# Patient Record
Sex: Female | Born: 1955 | Race: White | Hispanic: No | Smoking: Never smoker
Health system: Southern US, Community
[De-identification: ages and names within clinical notes are randomized; demographics above are authoritative.]

## PROBLEM LIST (undated history)

## (undated) DIAGNOSIS — E119 Type 2 diabetes mellitus without complications: Secondary | ICD-10-CM

## (undated) DIAGNOSIS — I10 Essential (primary) hypertension: Secondary | ICD-10-CM

## (undated) HISTORY — DX: Type 2 diabetes mellitus without complications: E11.9

## (undated) HISTORY — PX: FRACTURE SURGERY: SHX138

## (undated) HISTORY — PX: OTHER SURGICAL HISTORY: SHX169

## (undated) HISTORY — PX: SHOULDER SURGERY: SHX246

## (undated) HISTORY — PX: EYE SURGERY: SHX253

## (undated) HISTORY — DX: Essential (primary) hypertension: I10

## (undated) HISTORY — PX: DILATION AND CURETTAGE OF UTERUS: SHX78

---

## 2001-03-16 ENCOUNTER — Emergency Department (HOSPITAL_COMMUNITY): Admission: EM | Admit: 2001-03-16 | Discharge: 2001-03-16 | Payer: Self-pay | Admitting: *Deleted

## 2001-03-16 ENCOUNTER — Encounter: Payer: Self-pay | Admitting: *Deleted

## 2003-01-10 ENCOUNTER — Ambulatory Visit (HOSPITAL_COMMUNITY): Admission: RE | Admit: 2003-01-10 | Discharge: 2003-01-10 | Payer: Self-pay | Admitting: Family Medicine

## 2003-08-28 ENCOUNTER — Ambulatory Visit (HOSPITAL_COMMUNITY): Admission: RE | Admit: 2003-08-28 | Discharge: 2003-08-28 | Payer: Self-pay | Admitting: Obstetrics and Gynecology

## 2003-09-25 ENCOUNTER — Ambulatory Visit (HOSPITAL_COMMUNITY): Admission: RE | Admit: 2003-09-25 | Discharge: 2003-09-25 | Payer: Self-pay | Admitting: Internal Medicine

## 2003-09-26 ENCOUNTER — Encounter (INDEPENDENT_AMBULATORY_CARE_PROVIDER_SITE_OTHER): Payer: Self-pay | Admitting: *Deleted

## 2003-09-26 ENCOUNTER — Ambulatory Visit (HOSPITAL_COMMUNITY): Admission: RE | Admit: 2003-09-26 | Discharge: 2003-09-26 | Payer: Self-pay | Admitting: General Surgery

## 2003-09-26 ENCOUNTER — Ambulatory Visit (HOSPITAL_BASED_OUTPATIENT_CLINIC_OR_DEPARTMENT_OTHER): Admission: RE | Admit: 2003-09-26 | Discharge: 2003-09-26 | Payer: Self-pay | Admitting: General Surgery

## 2003-10-07 ENCOUNTER — Ambulatory Visit (HOSPITAL_COMMUNITY): Admission: RE | Admit: 2003-10-07 | Discharge: 2003-10-07 | Payer: Self-pay | Admitting: Family Medicine

## 2008-11-28 ENCOUNTER — Ambulatory Visit (HOSPITAL_COMMUNITY): Admission: RE | Admit: 2008-11-28 | Discharge: 2008-11-28 | Payer: Self-pay | Admitting: Obstetrics and Gynecology

## 2010-01-31 ENCOUNTER — Encounter: Payer: Self-pay | Admitting: Obstetrics and Gynecology

## 2010-02-01 ENCOUNTER — Encounter: Payer: Self-pay | Admitting: Obstetrics and Gynecology

## 2010-02-23 ENCOUNTER — Emergency Department (HOSPITAL_COMMUNITY)
Admission: EM | Admit: 2010-02-23 | Discharge: 2010-02-23 | Disposition: A | Discharge status: Home / Self Care | Payer: BC Managed Care – PPO | Attending: Emergency Medicine | Admitting: Emergency Medicine

## 2010-02-23 ENCOUNTER — Emergency Department (HOSPITAL_COMMUNITY): Payer: BC Managed Care – PPO

## 2010-02-23 DIAGNOSIS — R079 Chest pain, unspecified: Secondary | ICD-10-CM | POA: Insufficient documentation

## 2010-02-23 DIAGNOSIS — I10 Essential (primary) hypertension: Secondary | ICD-10-CM | POA: Insufficient documentation

## 2010-02-23 DIAGNOSIS — E119 Type 2 diabetes mellitus without complications: Secondary | ICD-10-CM | POA: Insufficient documentation

## 2010-02-23 DIAGNOSIS — Z79899 Other long term (current) drug therapy: Secondary | ICD-10-CM | POA: Insufficient documentation

## 2010-02-23 LAB — CBC
HCT: 36.1 % (ref 36.0–46.0)
Hemoglobin: 12.5 g/dL (ref 12.0–15.0)
MCH: 29.1 pg (ref 26.0–34.0)
MCHC: 34.6 g/dL (ref 30.0–36.0)
MCV: 84 fL (ref 78.0–100.0)
Platelets: 209 K/uL (ref 150–400)
RBC: 4.3 MIL/uL (ref 3.87–5.11)
RDW: 13.4 % (ref 11.5–15.5)
WBC: 7.3 K/uL (ref 4.0–10.5)

## 2010-02-23 LAB — DIFFERENTIAL
Basophils Absolute: 0 K/uL (ref 0.0–0.1)
Basophils Relative: 0 % (ref 0–1)
Eosinophils Absolute: 0.2 K/uL (ref 0.0–0.7)
Eosinophils Relative: 3 % (ref 0–5)
Lymphocytes Relative: 22 % (ref 12–46)
Lymphs Abs: 1.6 K/uL (ref 0.7–4.0)
Monocytes Absolute: 0.8 10*3/uL (ref 0.1–1.0)
Monocytes Relative: 11 % (ref 3–12)
Neutro Abs: 4.7 10*3/uL (ref 1.7–7.7)
Neutrophils Relative %: 65 % (ref 43–77)

## 2010-02-23 LAB — CK TOTAL AND CKMB (NOT AT ARMC)
CK, MB: 1.7 ng/mL (ref 0.3–4.0)
Relative Index: INVALID (ref 0.0–2.5)
Total CK: 63 U/L (ref 7–177)

## 2010-02-23 LAB — TROPONIN I: Troponin I: 0.02 ng/mL (ref 0.00–0.06)

## 2010-02-23 LAB — BASIC METABOLIC PANEL
BUN: 8 mg/dL (ref 6–23)
Calcium: 9.2 mg/dL (ref 8.4–10.5)
Creatinine, Ser: 0.57 mg/dL (ref 0.4–1.2)
GFR calc Af Amer: >60 mL/min (ref >60)
GFR calc non Af Amer: >60 mL/min (ref >60)
Sodium: 137 mEq/L (ref 135–145)

## 2010-02-23 LAB — POCT CARDIAC MARKERS
CKMB, poc: <1 ng/mL — ABNORMAL LOW (ref 1.0–8.0)
Myoglobin, poc: 41.6 ng/mL (ref 12–200)
Troponin i, poc: 0.07 ng/mL (ref 0.00–0.09)

## 2010-02-23 LAB — D-DIMER, QUANTITATIVE: D-Dimer, Quant: 0.4 ug{FEU}/mL (ref 0.00–0.48)

## 2010-02-23 LAB — BASIC METABOLIC PANEL WITH GFR
CO2: 27 meq/L (ref 19–32)
Chloride: 100 meq/L (ref 96–112)
Glucose, Bld: 158 mg/dL — ABNORMAL HIGH (ref 70–99)
Potassium: 3.6 meq/L (ref 3.5–5.1)

## 2010-05-29 NOTE — Op Note (Signed)
NAME:  Jennifer Carey, Jennifer Carey                           ACCOUNT NO.:  [id number]   MEDICAL RECORD NO.:  [id number]                   PATIENT TYPE:  AMB   LOCATION:  DAY                                  FACILITY:  [facility]   PHYSICIAN:  Lionel December, M.D.                 DATE OF BIRTH:  [birth year]   DATE OF PROCEDURE:  09/25/2003  DATE OF DISCHARGE:                                 OPERATIVE REPORT   PROCEDURE:  Total colonoscopy.   INDICATIONS FOR PROCEDURE:  Jennifer Carey is a 55 year old Caucasian female who was  noted to have Hemoccult-positive stool on exam recently.  She had one five  years ago but opted not to be evaluated.  She has occasional hematochezia.  Family history is negative for colorectal carcinoma.  She really does not  have any GI symptoms.  She does take ASA every now and then but has not had  any in the last four weeks or so.  The procedure risks were reviewed with  the patient, and informed consent was obtained.   PREOPERATIVE MEDICATIONS:  Demerol 50 mg IV, Versed 6 mg IV in divided dose.   FINDINGS:  The procedure was performed in the endoscopy suite.  The  patient's vital signs and O2 saturations were monitored during the procedure  and remained stable.  The patient was placed in the left lateral recumbent  position and rectal examination performed.  No abnormality noted on external  or digital exam.  The Olympus videoscope was placed into the rectum and  advanced into the region of the sigmoid colon and beyond.  The preparation  was satisfactory.  The scope was then advanced into the cecum which was  identified by the appendiceal orifice and ileocecal valve.  Pictures were  taken for the record.  As the scope was withdrawn, the colonic mucosa was  once again carefully examined.  There were diverticula scattered throughout  the colon.  The larger ones were more in the proximal transverse colon and  ascending colon.  There were no polyps and/or tumor masses.  The rectal  mucosa  was normal.  The scope was retroflexed to examine the anorectal  junction, and she had small hemorrhoids below the dentate line and a small  anal papilla.  The endoscope was straightened and withdrawn.  The patient  tolerated the procedure well.   FINAL DIAGNOSES:  1.  Pancolonic diverticulosis.  2.  Small external hemorrhoids.  3.  Her Hemoccult-positive stool could have been due to nonsteroidal anti-      inflammatory drug use.   RECOMMENDATIONS:  1.  High fiber diet.  2.  Citrucel one tablespoon full daily.  3.  She will have CBC and Hemoccults repeated in three months through our      office.  If her H&H is normal and stool is guaiac-negative, no further      evaluation would be needed.  ___________________________________________                                            Lionel December, M.D.   NR/MEDQ  D:  09/25/2003  T:  09/25/2003  Job:  [id number]   cc:   Cyril Mourning, P.A.   Tilda Burrow, M.D.  [address] [place]  [place] [address]  Fax: [phone]   Corrie Mckusick, M.D.  [address] [place]ure[place]s. [place]  [place]  [place] [address]  Fax: [phone]

## 2010-05-29 NOTE — Op Note (Signed)
NAME:  Jennifer Carey, Jennifer Carey                           ACCOUNT NO.:  [id number]   MEDICAL RECORD NO.:  [id number]                   PATIENT TYPE:  AMB   LOCATION:  DSC                                  FACILITY:  [facility]   PHYSICIAN:  Rose Phi. Maple Hudson, M.D.                DATE OF BIRTH:  December 06, 1955   DATE OF PROCEDURE:  09/26/2003  DATE OF DISCHARGE:                                 OPERATIVE REPORT   PREOPERATIVE DIAGNOSIS:  Intraductal papilloma of the right breast.   POSTOPERATIVE DIAGNOSIS:  Intraductal papilloma of the right breast.   OPERATION:  Excision of intraductal papilloma of the right breast.   SURGEON:  Rose Phi. Maple Hudson, M.D.   ANESTHESIA:  MAC anesthesia.   DESCRIPTION OF PROCEDURE:  The patient placed on the operating table and the  right breast prepped and draped in the usual fashion.  Her discharge and  ductogram had shown the lesion at the 9 o'clock position.  A curvilinear  circumareolar incision was outlined centered at the 9 o'clock position.  The  area was then infiltrated with a local anesthetic mixture.  Incision was  then made and I dissected up an areolar flap to the nipple and then clearly  could see the duct with the material in it.  I excised this and obtained  hemostasis with a cautery.  Subcuticular closure with 4-0 Monocryl was  carried out.  Skin glue was used.  Dressing applied.  The patient  transferred to the recovery room in satisfactory condition having tolerated  the procedure well.                                               Rose Phi. Maple Hudson, M.D.    PRY/MEDQ  D:  09/26/2003  T:  09/26/2003  Job:  [id number]

## 2010-05-29 NOTE — Consult Note (Signed)
NAME:  Boyko, SERGIO HOBART                           ACCOUNT NO.:  [id number]   MEDICAL RECORD NO.:  [id number]                   PATIENT TYPE:  OUT   LOCATION:  RAD                                  FACILITY:  [facility]   PHYSICIAN:  Lionel December, M.D.                 DATE OF BIRTH:  1955/04/03   DATE OF CONSULTATION:  08/29/2003  DATE OF DISCHARGE:                                   CONSULTATION   REASON FOR CONSULTATION:  Hemoccult positive stool on rectal examination.   HISTORY OF PRESENT ILLNESS:  Dari is a 55 year old Caucasian female who  presents today for further evaluation of Hemoccult positive stool on rectal  examination. She tells me that she actually was found to have Hemoccult  positive stools on rectal examination 5 years ago but did not keep her  appointment to arrange for colonoscopy at that time. She saw Cyril Mourning recently for a checkup and again, was found to have Hemoccult  positive stools on rectal examination. The patient has had some bright red  blood per rectum intermittently, which she relates to a hemorrhoid. She  generally has a bowel movement 1 to 2 times daily but tells me that she will  actually sit on the toilet and strain in order to have a bowel movement.  Stools are very soft but she tells me that she is in the habit or forcing  herself to go to the bathroom. She denies any melena. Denies any abdominal  pain, nausea, vomiting or heartburn. She previously has been on aspirin 1/2  of a 325 mg tablet daily for more than 5 years but stopped this due to the  recent blood in her stool. She has never had a colonoscopy. There is no  family history of colorectal cancer.   CURRENT MEDICATIONS:  Lisinopril/HCTZ 20/25 mg q.d. and multivitamin q.d.   ALLERGIES:  No known drug allergies.   PAST MEDICAL HISTORY:  Hypertension, obesity, right breast cyst to undergo  biopsy next week, status post left arm and shoulder surgery due to fracture,  status post D&C.   FAMILY HISTORY:  Mother diagnosed with breast cancer age 20. She has been in  remission for 10 years. Father died of a myocardial infarction. No family  history of colorectal cancer.   SOCIAL HISTORY:  She is married for 13 years. Has 1 child. She is employed  with [employer]. She has never been a smoker. She occasionally  drinks beer.   REVIEW OF SYSTEMS:  Please see HPI for GI. GENERAL:  Denies any  unintentional weight loss. CARDIOPULMONARY:  Denies any chest pain or  shortness of breath.   PHYSICAL EXAMINATION:  VITAL SIGNS:  Weight 286.5 pounds. Height 5 foot 8.  Blood pressure 140/90, pulse 74.  GENERAL:  A pleasant, obese, Caucasian female in no acute distress.  SKIN:  Warm and dry. No jaundice.  HEENT:  Conjunctivae pink. Sclerae nonicteric. Oropharyngeal mucosa moist  and pink. No lesions, erythema or exudate. No lymphadenopathy, thyromegaly.  CHEST:  Lungs clear to auscultation.  CARDIAC:  Examination reveals regular rate and rhythm. Normal S1 and S2. No  murmur, rub, or gallop.  ABDOMEN:  Positive bowel sounds. Obese but symmetrical and soft. Nontender.  No organomegaly or masses.  EXTREMITIES:  No edema.  RECTAL:  Examination deferred.   IMPRESSION:  Anthony is a pleasant 55 year old lady who was found to have  Hemoccult positive stool on recent rectal examination. She reports having  Hemoccult positive stools 5 years ago on rectal examination as well. She has  had intermittent hematochezia. She needs to have a colonoscopy for further  evaluation of these symptoms. I discussed risks, alternatives, and benefits  with the patient. She is agreeable to proceed.   PLAN:  1. Colonoscopy in the near future.  2. She will continue to hold aspirin for now but based on findings, may be     able to resume this in the near future.  3. Further recommendations to follow.   I would like to thank Dr. Emelda Fear for allowing [place] to participate in the  care of this  patient.     ________________________________________  ___________________________________________  Tana Coast, P.A.                         Lionel December, M.D.   LL/MEDQ  D:  08/29/2003  T:  08/29/2003  Job:  [id number]   cc:   Tilda Burrow, M.D.  [address] [place]  [place] [address]  Fax: [phone]   Lionel December, M.D.  [address]  [place]  [place] [address]  Fax: [phone]   Tana Coast, P.A.  Fax: [phone]

## 2013-09-06 ENCOUNTER — Encounter (INDEPENDENT_AMBULATORY_CARE_PROVIDER_SITE_OTHER): Payer: Self-pay | Admitting: *Deleted

## 2014-10-23 ENCOUNTER — Encounter (INDEPENDENT_AMBULATORY_CARE_PROVIDER_SITE_OTHER): Payer: Self-pay | Admitting: *Deleted

## 2016-08-02 ENCOUNTER — Other Ambulatory Visit: Payer: Self-pay | Admitting: Adult Health

## 2016-08-02 DIAGNOSIS — Z1231 Encounter for screening mammogram for malignant neoplasm of breast: Secondary | ICD-10-CM

## 2016-08-26 ENCOUNTER — Ambulatory Visit (INDEPENDENT_AMBULATORY_CARE_PROVIDER_SITE_OTHER): Payer: BLUE CROSS/BLUE SHIELD | Admitting: Adult Health

## 2016-08-26 ENCOUNTER — Other Ambulatory Visit (HOSPITAL_COMMUNITY)
Admission: RE | Admit: 2016-08-26 | Discharge: 2016-08-26 | Disposition: A | Discharge status: Home / Self Care | Payer: BLUE CROSS/BLUE SHIELD | Source: Ambulatory Visit | Attending: Adult Health | Admitting: Adult Health

## 2016-08-26 ENCOUNTER — Encounter: Payer: Self-pay | Admitting: Adult Health

## 2016-08-26 ENCOUNTER — Ambulatory Visit (HOSPITAL_COMMUNITY)
Admission: RE | Admit: 2016-08-26 | Discharge: 2016-08-26 | Disposition: A | Discharge status: Home / Self Care | Payer: BLUE CROSS/BLUE SHIELD | Source: Ambulatory Visit | Attending: Adult Health | Admitting: Adult Health

## 2016-08-26 VITALS — BP 190/98 | HR 76 | Ht 68.0 in | Wt 263.4 lb

## 2016-08-26 DIAGNOSIS — Z1212 Encounter for screening for malignant neoplasm of rectum: Secondary | ICD-10-CM

## 2016-08-26 DIAGNOSIS — Z1211 Encounter for screening for malignant neoplasm of colon: Secondary | ICD-10-CM | POA: Diagnosis not present

## 2016-08-26 DIAGNOSIS — Z01419 Encounter for gynecological examination (general) (routine) without abnormal findings: Secondary | ICD-10-CM | POA: Diagnosis not present

## 2016-08-26 DIAGNOSIS — Z1231 Encounter for screening mammogram for malignant neoplasm of breast: Secondary | ICD-10-CM | POA: Insufficient documentation

## 2016-08-26 DIAGNOSIS — I10 Essential (primary) hypertension: Secondary | ICD-10-CM | POA: Insufficient documentation

## 2016-08-26 LAB — HEMOCCULT GUIAC POC 1CARD (OFFICE): Fecal Occult Blood, POC: NEGATIVE

## 2016-08-26 NOTE — Addendum Note (Signed)
Addended by: Colen DarlingYOUNG, Talan Gildner S on: 08/26/2016 09:33 AM   Modules accepted: Orders

## 2016-08-26 NOTE — Progress Notes (Signed)
Patient ID: Jennifer RobinsSusan C Carey, female   DOB: [birth year], 61 y.o.   MRN: [id number] History of Present Illness:  Jennifer PikesSusan is a 61 year old white female, widowed, G1P1, PM in for well woman gyn exam and pap, it has been about 8 years since last one.Had mammogram today.She is on meds for diabetes and BP, last A1c was good she says, like 6.3. She tried phentermine for weight loss and had pain in chest so stopped that. She is still working.  PCP is Dr Phillips OdorGolding.   Current Medications, Allergies, Past Medical History, Past Surgical History, Family History and Social History were reviewed in [facility] electronic medical record.     Review of Systems:  Patient denies any headaches, hearing loss, fatigue, blurred vision, shortness of breath, chest pain, abdominal pain, problems with bowel movements, urination, or intercourse(not having sex). No joint pain or mood swings.    Physical Exam:BP (!) 190/98 (BP Location: Left Arm, Cuff Size: Large)   Pulse 76   Ht 5\' 8"  (1.727 m)   Wt 263 lb 6.4 oz (119.5 kg)   BMI 40.05 kg/m  General:  Well developed, well nourished, no acute distress Skin:  Warm and dry Neck:  Midline trachea, normal thyroid, good ROM, no lymphadenopathy,no carotid bruits heard Lungs; Clear to auscultation bilaterally Breast:  No dominant palpable mass, retraction, or nipple discharge Cardiovascular: Regular rate and rhythm Abdomen:  Soft, non tender, no hepatosplenomegaly Pelvic:  External genitalia is normal in appearance, no lesions.  The vagina is pale with loss of color, moisture and rugae. Urethra has no lesions or masses. The cervix is smooth, pap with HPV performed.  Uterus is felt to be normal size, shape, and contour.  No adnexal masses or tenderness noted.Bladder is non tender, no masses felt. Rectal: Good sphincter tone, no polyps, or hemorrhoids felt.  Hemoccult negative. Extremities/musculoskeletal:  No swelling noted, no clubbing or cyanosis,+varicose veins, R>L Psych:  No  mood changes, alert and cooperative,seems happy PHQ 2 score 0.She says she is getting eye exam has been 25 years since last one and is going to do Cologard instead of colonoscopy.   Impression: 1. Encounter for routine gynecological examination with Papanicolaou smear of cervix   2. Screening for colorectal cancer   3. Essential hypertension       Plan: Physical in 1 year Pap in 3 if normal Mammogram yearly Labs with PCP  F/U with Dr Phillips OdorGolding about BP

## 2016-08-26 NOTE — Patient Instructions (Addendum)
Physical in 1 year Pap in 3 if normal Mammogram yearly Labs with PCP  F/U with Dr Phillips OdorGolding about BP

## 2016-08-30 LAB — CYTOLOGY - PAP
Diagnosis: NEGATIVE
HPV (WINDOPATH): NOT DETECTED

## 2017-03-03 ENCOUNTER — Emergency Department (HOSPITAL_COMMUNITY)
Admission: EM | Admit: 2017-03-03 | Discharge: 2017-03-03 | Disposition: A | Discharge status: Home / Self Care | Payer: BLUE CROSS/BLUE SHIELD | Attending: Emergency Medicine | Admitting: Emergency Medicine

## 2017-03-03 ENCOUNTER — Encounter (HOSPITAL_COMMUNITY): Payer: Self-pay | Admitting: Emergency Medicine

## 2017-03-03 ENCOUNTER — Emergency Department (HOSPITAL_COMMUNITY): Payer: BLUE CROSS/BLUE SHIELD

## 2017-03-03 DIAGNOSIS — Z79899 Other long term (current) drug therapy: Secondary | ICD-10-CM | POA: Insufficient documentation

## 2017-03-03 DIAGNOSIS — I10 Essential (primary) hypertension: Secondary | ICD-10-CM | POA: Diagnosis not present

## 2017-03-03 DIAGNOSIS — E119 Type 2 diabetes mellitus without complications: Secondary | ICD-10-CM | POA: Diagnosis not present

## 2017-03-03 DIAGNOSIS — R079 Chest pain, unspecified: Secondary | ICD-10-CM | POA: Diagnosis not present

## 2017-03-03 DIAGNOSIS — F419 Anxiety disorder, unspecified: Secondary | ICD-10-CM | POA: Diagnosis not present

## 2017-03-03 DIAGNOSIS — Z7984 Long term (current) use of oral hypoglycemic drugs: Secondary | ICD-10-CM | POA: Diagnosis not present

## 2017-03-03 LAB — BASIC METABOLIC PANEL
ANION GAP: 11 (ref 5–15)
BUN: 18 mg/dL (ref 6–20)
CHLORIDE: 98 mmol/L — AB (ref 101–111)
CO2: 27 mmol/L (ref 22–32)
Calcium: 9.8 mg/dL (ref 8.9–10.3)
Creatinine, Ser: 0.84 mg/dL (ref 0.44–1.00)
GFR calc Af Amer: >60 mL/min (ref >60)
Glucose, Bld: 168 mg/dL — ABNORMAL HIGH (ref 65–99)
Potassium: 3.7 mmol/L (ref 3.5–5.1)
SODIUM: 136 mmol/L (ref 135–145)

## 2017-03-03 LAB — CBC WITH DIFFERENTIAL/PLATELET
BASOS ABS: 0 10*3/uL (ref 0.0–0.1)
Basophils Relative: 0 %
EOS ABS: 0.2 10*3/uL (ref 0.0–0.7)
Eosinophils Relative: 3 %
HCT: 39.2 % (ref 36.0–46.0)
HEMOGLOBIN: 13 g/dL (ref 12.0–15.0)
LYMPHS ABS: 1.6 10*3/uL (ref 0.7–4.0)
LYMPHS PCT: 21 %
MCH: 29 pg (ref 26.0–34.0)
MCHC: 33.2 g/dL (ref 30.0–36.0)
MCV: 87.3 fL (ref 78.0–100.0)
Monocytes Absolute: 0.8 10*3/uL (ref 0.1–1.0)
Monocytes Relative: 11 %
NEUTROS PCT: 65 %
Neutro Abs: 4.8 10*3/uL (ref 1.7–7.7)
Platelets: 211 10*3/uL (ref 150–400)
RBC: 4.49 MIL/uL (ref 3.87–5.11)
RDW: 13.3 % (ref 11.5–15.5)
WBC: 7.5 10*3/uL (ref 4.0–10.5)

## 2017-03-03 LAB — TROPONIN I

## 2017-03-03 NOTE — ED Provider Notes (Signed)
[facility] EMERGENCY DEPARTMENT Provider Note   CSN: [id number] Arrival date & time: 03/03/17  [id number]     History   Chief Complaint Chief Complaint  Patient presents with  . Chest Pain    HPI Jennifer Carey is a 62 y.o. female.  Patient is a 62 year old female who presents to the emergency department with a complaint of chest pain.  Patient has a history of hypertension, non-insulin-requiring diabetes mellitus.  Patient states that approximately 3 days ago she began having pain in her mid chest area which she rated as a 3 on a scale of 1-10.  She states that the pain is mostly aggravating at night.  She describes it as an ache, or sensation of a pulled muscle in her chest.  The pain lasted about 15 minutes to approximately 1 hour.  This pain seems to come and go.  There is no complaint of shortness of breath, loss of consciousness, nausea vomiting, or altered mental status.  Patient is not had any recent injury or trauma to the chest.  Is been no recent operations or procedures.  It is of note that the patient uses a baby aspirin daily.  She states she took 2 baby aspirin this morning because the pain changed from a 3 to a 7 on a scale of 1-10.  She attempted to go to work, but she was still having pain, became frightened about the pain, and presented to the emergency department.  The patient states that she has been under a great deal of stress recently and she is not sure if this has anything to do with her chest or not.  No previous history of cardiac related issues.      Past Medical History:  Diagnosis Date  . Diabetes mellitus without complication (HCC)   . Hypertension     Patient Active Problem List   Diagnosis Date Noted  . Essential hypertension 08/26/2016  . Encounter for routine gynecological examination with Papanicolaou smear of cervix 08/26/2016    Past Surgical History:  Procedure Laterality Date  . arm surgery Left   . SHOULDER SURGERY      OB History    Gravida Para Term Preterm AB Living   1 1       1    SAB TAB Ectopic Multiple Live Births                   Home Medications    Prior to Admission medications   Medication Sig Start Date End Date Taking? Authorizing Provider  nebivolol (BYSTOLIC) 10 MG tablet Take 10 mg by mouth daily.    [provider]  olmesartan (BENICAR) 40 MG tablet Take 40 mg by mouth daily.    [provider]  sitaGLIPtin-metformin (JANUMET) 50-1000 MG tablet Take 1 tablet by mouth 2 (two) times daily with a meal.    [provider]    Family History Family History  Problem Relation Age of Onset  . Heart attack Father   . Breast cancer Mother   . Lung cancer Sister     Social History Social History   Tobacco Use  . Smoking status: Never Smoker  . Smokeless tobacco: Never Used  Substance Use Topics  . Alcohol use: Yes    Comment: occasionally  . Drug use: No     Allergies   Patient has no known allergies.   Review of Systems Review of Systems  Constitutional: Negative for activity change.  All ROS Neg except as noted in HPI  HENT: Negative for nosebleeds.   Eyes: Negative for photophobia and discharge.  Respiratory: Negative for cough, shortness of breath and wheezing.        Patient speaks in complete sentences without problem.  There is symmetrical rise and fall of the chest.  Cardiovascular: Positive for chest pain. Negative for palpitations.  Gastrointestinal: Negative for abdominal pain and blood in stool.  Genitourinary: Negative for dysuria, frequency and hematuria.  Musculoskeletal: Negative for arthralgias, back pain and neck pain.  Skin: Negative.   Neurological: Negative for dizziness, seizures and speech difficulty.  Psychiatric/Behavioral: Negative for confusion and hallucinations. The patient is nervous/anxious.      Physical Exam Updated Vital Signs BP (!) 184/77 (BP Location: Right Arm)   Pulse 64   Temp 98.5 F (36.9 C) (Oral)    Resp 19   Ht 5\' 8"  (1.727 m)   Wt 122.5 kg (270 lb)   SpO2 97%   BMI 41.05 kg/m   Physical Exam  Constitutional: She is oriented to person, place, and time. She appears well-developed and well-nourished.  Non-toxic appearance.  HENT:  Head: Normocephalic.  Right Ear: Tympanic membrane and external ear normal.  Left Ear: Tympanic membrane and external ear normal.  Eyes: EOM and lids are normal. Pupils are equal, round, and reactive to light.  Neck: Normal range of motion. Neck supple. Carotid bruit is not present.  Cardiovascular: Normal rate, regular rhythm, normal heart sounds, intact distal pulses and normal pulses.  Pulmonary/Chest: Breath sounds normal. No respiratory distress.  Abdominal: Soft. Bowel sounds are normal. There is no tenderness. There is no guarding.  Musculoskeletal: Normal range of motion.  Trace edema of the lower extremities.  Multiple varicose veins noted.  Dorsalis pedis pulses 2+.  Radial pulses are 2+.  Capillary refill less than 2 seconds. Negative Homans sign  Lymphadenopathy:       Head (right side): No submandibular adenopathy present.       Head (left side): No submandibular adenopathy present.    She has no cervical adenopathy.  Neurological: She is alert and oriented to person, place, and time. She has normal strength. No cranial nerve deficit or sensory deficit.  Skin: Skin is warm and dry.  Psychiatric: She has a normal mood and affect. Her speech is normal.  Nursing note and vitals reviewed.    ED Treatments / Results  Labs (all labs ordered are listed, but only abnormal results are displayed) Labs Reviewed  CBC WITH DIFFERENTIAL/PLATELET  BASIC METABOLIC PANEL  TROPONIN I    EKG  EKG Interpretation None       Radiology No results found.  Procedures Procedures (including critical care time)  Medications Ordered in ED Medications - No data to display   Initial Impression / Assessment and Plan / ED Course  I have reviewed  the triage vital signs and the nursing notes.  Pertinent labs & imaging results that were available during my care of the patient were reviewed by me and considered in my medical decision making (see chart for details).       Final Clinical Impressions(s) / ED Diagnoses Blood pressure is 184/77, otherwise the vital signs are within normal limits.  The pulse oximetry is 97% on room air.  Within normal limits by my interpretation.  Complete blood count is well within normal limits.  Electrocardiogram shows a normal sinus rhythm with some nonspecific T wave changes.  The heart score is 3.  Pulmonary  embolism unlikely by well's criteria. The complete blood count is well within normal limits.  Basic metabolic panel is nonacute.  The glucose is slightly elevated at 168.troponin is less than 0.03.  The chest x-ray shows borderline cardiomegaly but no acute changes or process.  Patient was observed in the emergency department on telemetry, and there are no high degree blocks or life-threatening arrhythmias noted.  Pulse oximetry remains well within normal limits.  A second troponin was obtained and also noted to be less than 0.03.  We will arrange for the patient to be referred to cardiology for additional workup.  Feel that it is safe for the patient to be discharged home at this time.  Patient agrees to return immediately if any changes in her condition, problems, or concerns.   Final diagnoses:  Chest pain, unspecified type  Anxiety    ED Discharge Orders    None       Ivery Quale, PA-C 03/03/17 1713    Loren Racer, MD 03/08/17 [phone]

## 2017-03-03 NOTE — Discharge Instructions (Addendum)
Your EKG, chest x-ray and lab work are negative for acute event at this time.  Please call Dr.McDowell for cardiology evaluation in the office. Please continue your current medications.  Return to the emergency department immediately if any changes in your condition, problems, or concerns.

## 2017-03-03 NOTE — ED Triage Notes (Signed)
Patient complaining of intermittent chest pain x 3 days.

## 2018-03-03 ENCOUNTER — Other Ambulatory Visit (HOSPITAL_COMMUNITY): Payer: Self-pay | Admitting: Adult Health

## 2018-03-03 DIAGNOSIS — Z1231 Encounter for screening mammogram for malignant neoplasm of breast: Secondary | ICD-10-CM

## 2018-03-15 ENCOUNTER — Encounter (HOSPITAL_COMMUNITY): Payer: Self-pay

## 2018-03-15 ENCOUNTER — Ambulatory Visit (HOSPITAL_COMMUNITY)
Admission: RE | Admit: 2018-03-15 | Discharge: 2018-03-15 | Disposition: A | Discharge status: Home / Self Care | Payer: BLUE CROSS/BLUE SHIELD | Source: Ambulatory Visit | Attending: Adult Health | Admitting: Adult Health

## 2018-03-15 DIAGNOSIS — Z1231 Encounter for screening mammogram for malignant neoplasm of breast: Secondary | ICD-10-CM | POA: Diagnosis not present

## 2018-03-15 IMAGING — MG DIGITAL SCREENING BILATERAL MAMMOGRAM WITH TOMO AND CAD
8 series · 8 of 24 positions shown · non-contrast
Comparison: Previous exam(s).

CLINICAL DATA: Screening.

EXAM:
DIGITAL SCREENING BILATERAL MAMMOGRAM WITH TOMO AND CAD

[R MLO synth-2D]
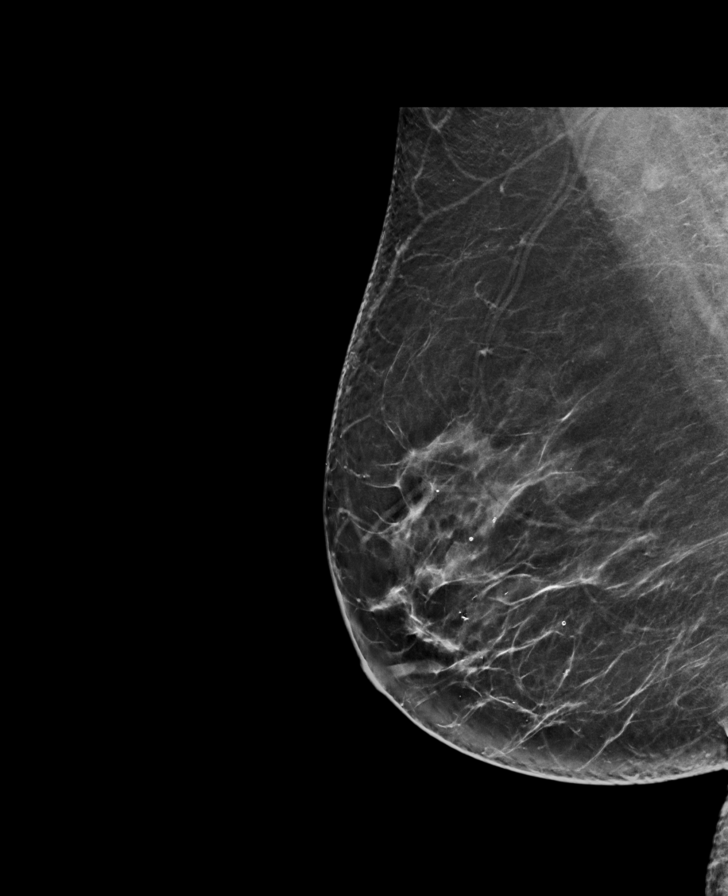

[L CC synth-2D]
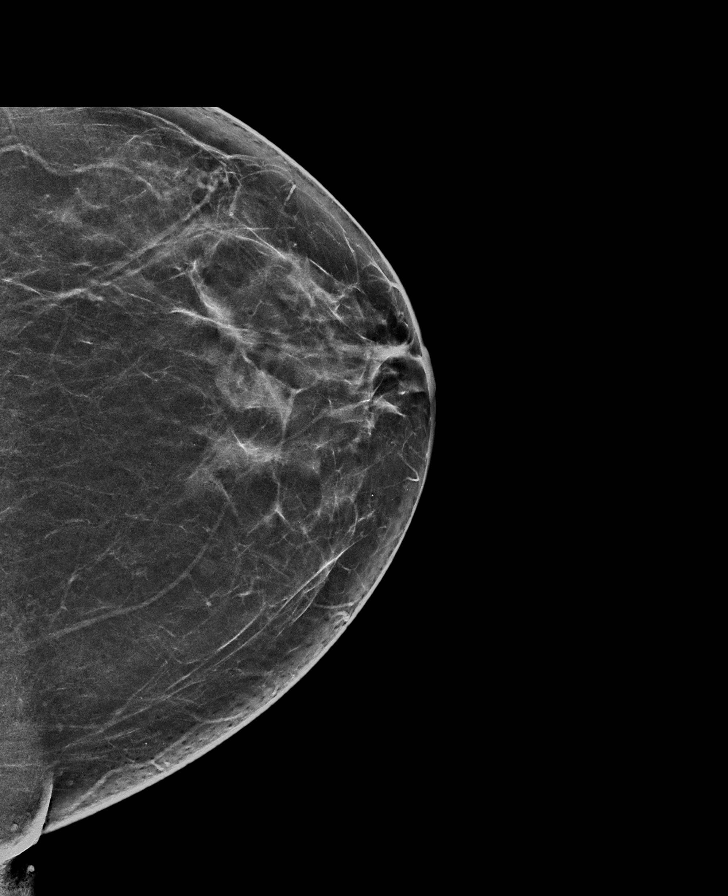

[L MLO synth-2D]
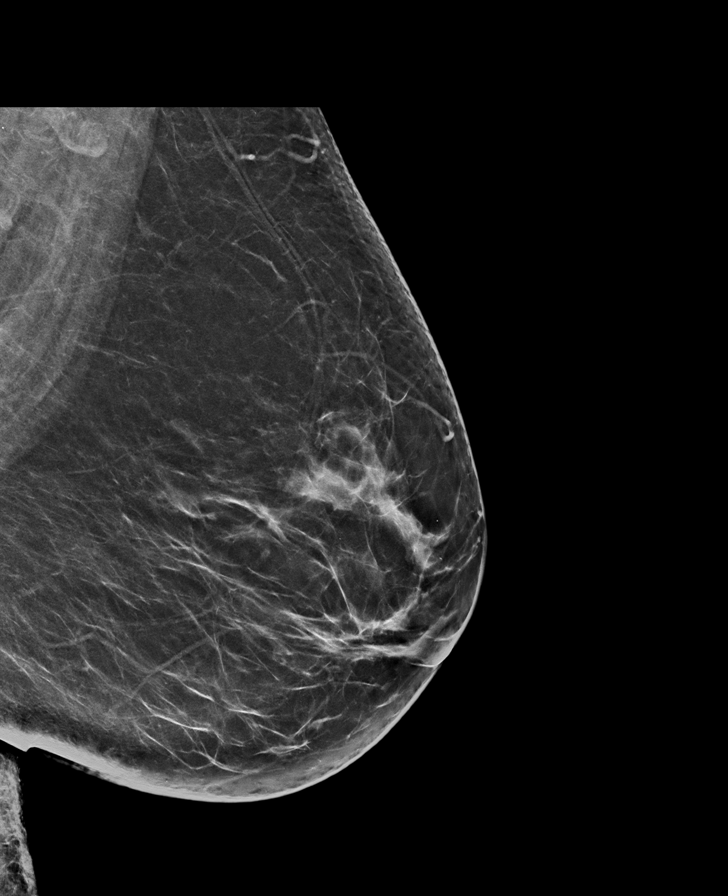

[R CC synth-2D]
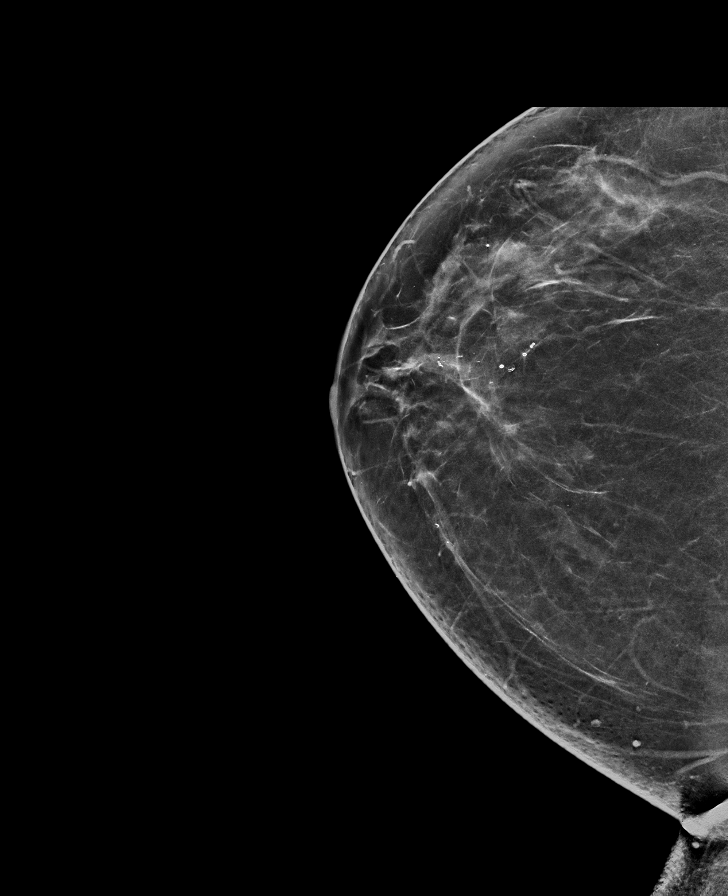

[L MLO tomo · tomo slice 40/79.0]
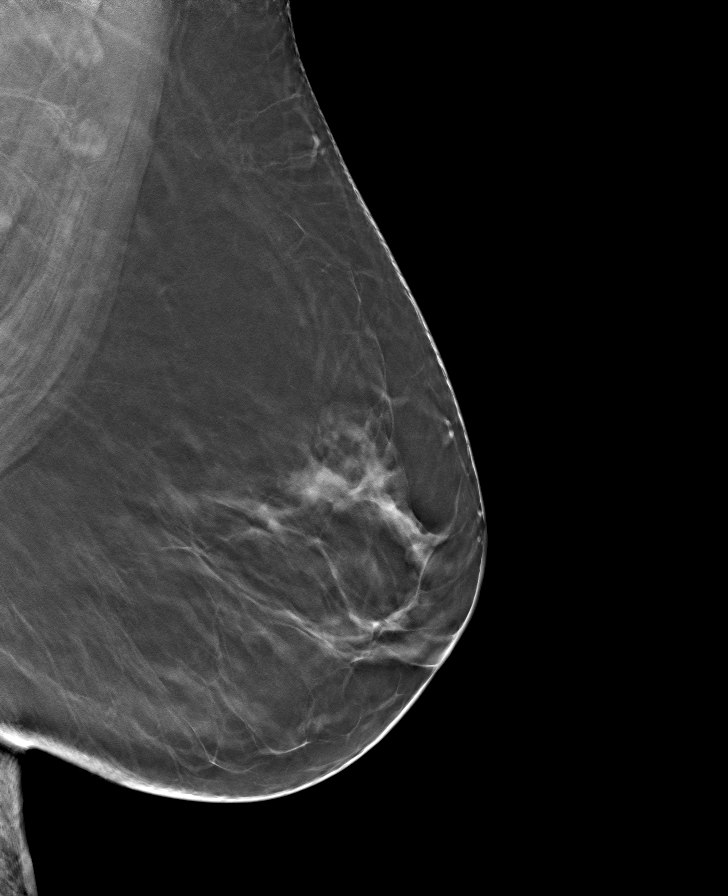

[R MLO tomo · tomo slice 40/79.0]
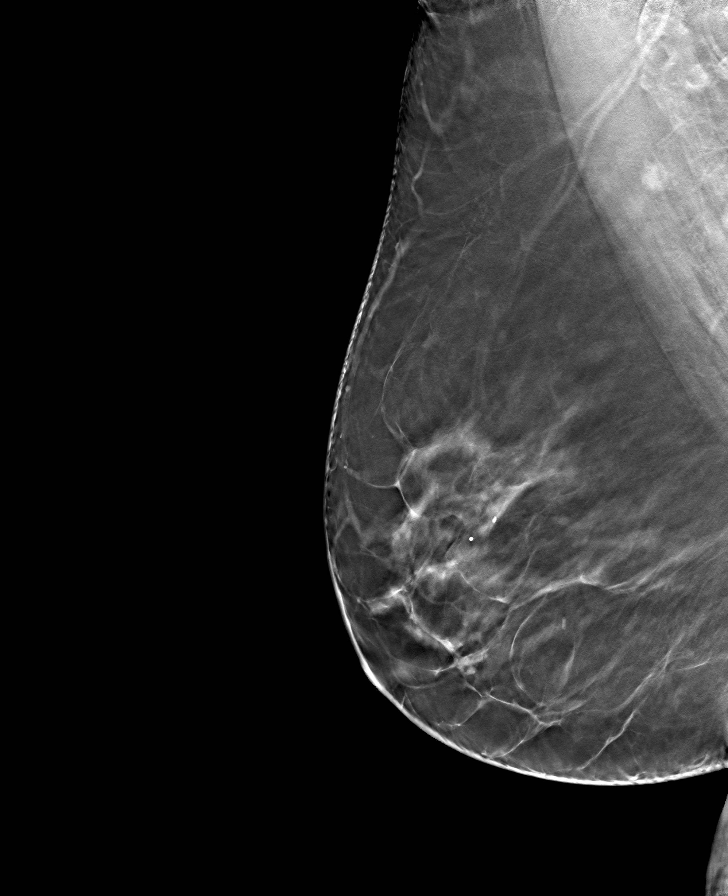

[R CC tomo · tomo slice 38/75.0]
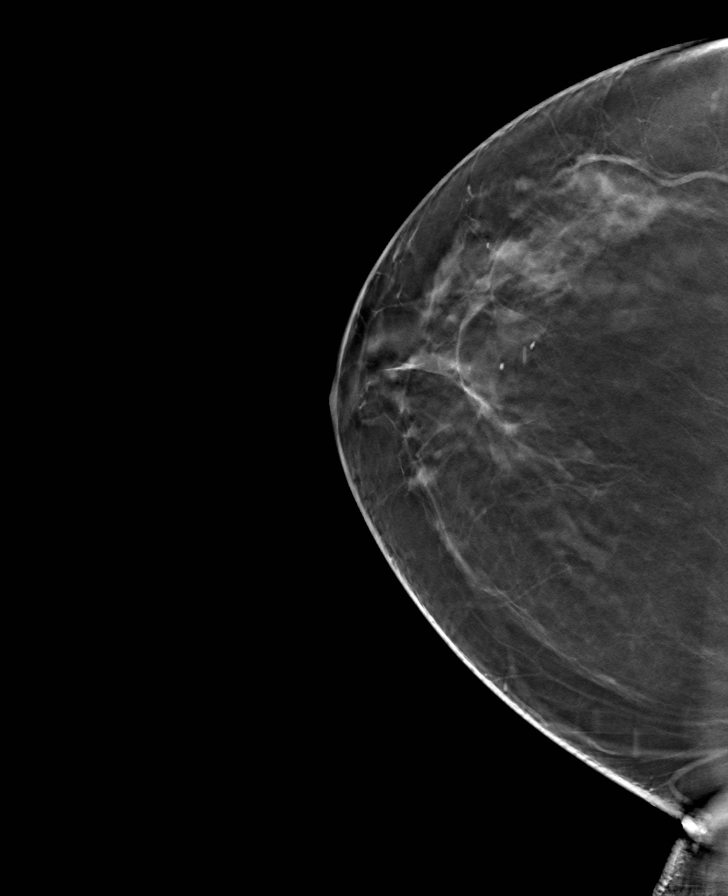

[L CC tomo · tomo slice 38/75.0]
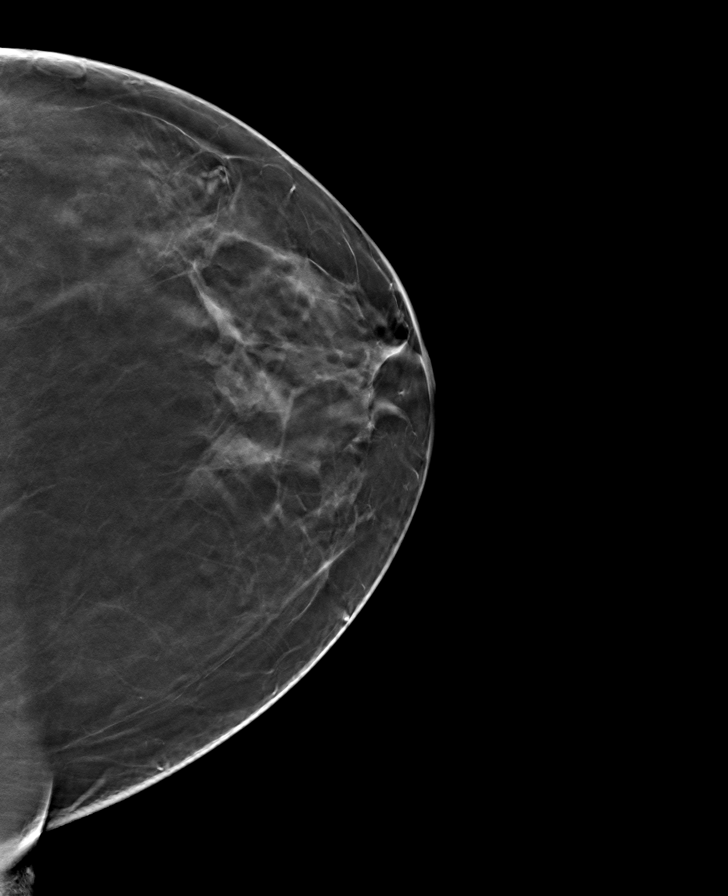

[8 of 24 positions shown; findings below may reference images not displayed]

ACR Breast Density Category b: There are scattered areas of
fibroglandular density.
FINDINGS: There are no findings suspicious for malignancy. Images were
processed with CAD.
IMPRESSION: No mammographic evidence of malignancy. A result letter of this
screening mammogram will be mailed directly to the patient.

RECOMMENDATION:
Screening mammogram in one year. (Code:[TQ])

BI-RADS CATEGORY  1: Negative.

## 2021-06-11 ENCOUNTER — Emergency Department (HOSPITAL_COMMUNITY): Payer: Medicare Other

## 2021-06-11 ENCOUNTER — Encounter (HOSPITAL_COMMUNITY): Payer: Self-pay | Admitting: Emergency Medicine

## 2021-06-11 ENCOUNTER — Emergency Department (HOSPITAL_COMMUNITY)
Admission: EM | Admit: 2021-06-11 | Discharge: 2021-06-11 | Disposition: A | Discharge status: Home / Self Care | Payer: Medicare Other | Attending: Emergency Medicine | Admitting: Emergency Medicine

## 2021-06-11 ENCOUNTER — Other Ambulatory Visit: Payer: Self-pay

## 2021-06-11 DIAGNOSIS — I10 Essential (primary) hypertension: Secondary | ICD-10-CM | POA: Insufficient documentation

## 2021-06-11 DIAGNOSIS — Z79899 Other long term (current) drug therapy: Secondary | ICD-10-CM | POA: Diagnosis not present

## 2021-06-11 DIAGNOSIS — E876 Hypokalemia: Secondary | ICD-10-CM | POA: Diagnosis not present

## 2021-06-11 DIAGNOSIS — R42 Dizziness and giddiness: Secondary | ICD-10-CM

## 2021-06-11 LAB — CBC
HCT: 39.7 % (ref 36.0–46.0)
Hemoglobin: 13.5 g/dL (ref 12.0–15.0)
MCH: 28.8 pg (ref 26.0–34.0)
MCHC: 34 g/dL (ref 30.0–36.0)
MCV: 84.8 fL (ref 80.0–100.0)
Platelets: 300 10*3/uL (ref 150–400)
RBC: 4.68 MIL/uL (ref 3.87–5.11)
RDW: 13.1 % (ref 11.5–15.5)
WBC: 10.4 10*3/uL (ref 4.0–10.5)
nRBC: 0 % (ref 0.0–0.2)

## 2021-06-11 LAB — BASIC METABOLIC PANEL
Anion gap: 8 (ref 5–15)
BUN: 14 mg/dL (ref 8–23)
CO2: 27 mmol/L (ref 22–32)
Calcium: 9.2 mg/dL (ref 8.9–10.3)
Chloride: 95 mmol/L — ABNORMAL LOW (ref 98–111)
Creatinine, Ser: 0.81 mg/dL (ref 0.44–1.00)
GFR, Estimated: >60 mL/min (ref >60)
Glucose, Bld: 167 mg/dL — ABNORMAL HIGH (ref 70–99)
Potassium: 3.3 mmol/L — ABNORMAL LOW (ref 3.5–5.1)
Sodium: 130 mmol/L — ABNORMAL LOW (ref 135–145)

## 2021-06-11 LAB — TROPONIN I (HIGH SENSITIVITY)
Troponin I (High Sensitivity): 3 ng/L (ref <18)
Troponin I (High Sensitivity): 3 ng/L (ref <18)

## 2021-06-11 MED ORDER — POTASSIUM CHLORIDE CRYS ER 20 MEQ PO TBCR: 20.0000 meq | EXTENDED_RELEASE_TABLET | Freq: Every day | ORAL | 0 refills | Status: AC

## 2021-06-11 MED ORDER — AMLODIPINE BESYLATE 5 MG PO TABS
5.0000 mg | ORAL_TABLET | Freq: Once | ORAL | Status: AC
  Administered 2021-06-11: 5 mg via ORAL
  Filled 2021-06-11: qty 1

## 2021-06-11 MED ORDER — AMLODIPINE BESYLATE 5 MG PO TABS
5.0000 mg | ORAL_TABLET | Freq: Every day | ORAL | 0 refills | Status: AC
Start: 2021-06-11 — End: ?

## 2021-06-11 NOTE — ED Provider Notes (Signed)
[facility] EMERGENCY DEPARTMENT Provider Note   CSN: [id number] Arrival date & time: 06/11/21  1342     History  Chief Complaint  Patient presents with   Hypertension    Jennifer Carey is a 66 y.o. female.  HPI She presents for evaluation of dizziness which occurred on awakening this morning.  Dizziness persisted.  She is able to walk but feels unsteady.  At one point she had a near syncopal episode.  She states that the dizziness is a spinning sensation.  It is worse when she moves her eyes or head.  She does not have any associated chest pain, shortness of breath, focal weakness or paresthesia.  She did have an episode of diaphoresis when she was lightheaded/near syncopal earlier today.  This resolved when she sat down.  She was able to come here by private vehicle.  She states she is able to walk.  She denies recent illnesses including fever or chills.    Home Medications Prior to Admission medications   Medication Sig Start Date End Date Taking? Authorizing Provider  amLODipine (NORVASC) 5 MG tablet Take 1 tablet (5 mg total) by mouth daily. 06/11/21  Yes Mancel Bale, MD  potassium chloride SA (KLOR-CON M) 20 MEQ tablet Take 1 tablet (20 mEq total) by mouth daily. 06/11/21  Yes Mancel Bale, MD  metoprolol tartrate (LOPRESSOR) 50 MG tablet Take 50 mg by mouth 2 (two) times daily. 03/21/21   [provider]  nebivolol (BYSTOLIC) 10 MG tablet Take 10 mg by mouth daily.    [provider]  olmesartan-hydrochlorothiazide (BENICAR HCT) 40-25 MG tablet Take 1 tablet by mouth daily. 12/07/16   [provider]  sitaGLIPtin-metformin (JANUMET) 50-1000 MG tablet Take 1 tablet by mouth 2 (two) times daily with a meal.    [provider]      Allergies    Patient has no known allergies.    Review of Systems   Review of Systems  Physical Exam Updated Vital Signs BP (!) 183/96   Pulse 68   Temp 98.2 F (36.8 C) (Oral)   Resp 16   Ht 5\' 8"  (1.727  m)   Wt 108.9 kg   SpO2 97%   BMI 36.49 kg/m  Physical Exam Vitals and nursing note reviewed.  Constitutional:      General: She is not in acute distress.    Appearance: She is well-developed. She is obese. She is not ill-appearing, toxic-appearing or diaphoretic.  HENT:     Head: Normocephalic and atraumatic.     Right Ear: External ear normal.     Left Ear: External ear normal.     Nose: No congestion or rhinorrhea.     Mouth/Throat:     Pharynx: No oropharyngeal exudate or posterior oropharyngeal erythema.  Eyes:     Conjunctiva/sclera: Conjunctivae normal.     Pupils: Pupils are equal, round, and reactive to light.  Neck:     Trachea: Phonation normal.  Cardiovascular:     Rate and Rhythm: Normal rate.  Pulmonary:     Effort: Pulmonary effort is normal.  Abdominal:     General: There is no distension.  Musculoskeletal:        General: Normal range of motion.     Cervical back: Normal range of motion and neck supple.  Skin:    General: Skin is warm and dry.  Neurological:     Mental Status: She is alert and oriented to person, place, and time.  Cranial Nerves: No cranial nerve deficit.     Sensory: No sensory deficit.     Motor: No abnormal muscle tone.     Coordination: Coordination normal.     Comments: No dysarthria aphasia or nystagmus.  Psychiatric:        Mood and Affect: Mood normal.        Behavior: Behavior normal.        Thought Content: Thought content normal.        Judgment: Judgment normal.    ED Results / Procedures / Treatments   Labs (all labs ordered are listed, but only abnormal results are displayed) Labs Reviewed  BASIC METABOLIC PANEL - Abnormal; Notable for the following components:      Result Value   Sodium 130 (*)    Potassium 3.3 (*)    Chloride 95 (*)    Glucose, Bld 167 (*)    All other components within normal limits  CBC  TROPONIN I (HIGH SENSITIVITY)  TROPONIN I (HIGH SENSITIVITY)    EKG EKG  Interpretation  Date/Time:  Thursday June 11 2021 14:16:34 EDT Ventricular Rate:  65 PR Interval:  201 QRS Duration: 111 QT Interval:  421 QTC Calculation: 438 R Axis:   -38 Text Interpretation: Sinus rhythm Left axis deviation Abnormal R-wave progression, late transition Borderline repolarization abnormality since last tracing no significant change Confirmed by Daleen Bo [phone]) on 06/11/2021 3:03:55 PM  Radiology DG Chest 2 View  Result Date: 06/11/2021 CLINICAL DATA:  Hypertension with dizziness. EXAM: CHEST - 2 VIEW COMPARISON:  Radiograph March 03, 2017 FINDINGS: The heart size and mediastinal contours are within normal limits. No focal consolidation. No pleural effusion. No pneumothorax. Thoracic spondylosis. IMPRESSION: No acute cardiopulmonary disease. Electronically Signed   By: Dahlia Bailiff M.D.   On: 06/11/2021 14:59   MR BRAIN WO CONTRAST  Result Date: 06/11/2021 CLINICAL DATA:  Dizziness, nonspecific. Cardiac or vascular cause suspected. EXAM: MRI HEAD WITHOUT CONTRAST TECHNIQUE: Multiplanar, multiecho pulse sequences of the brain and surrounding structures were obtained without intravenous contrast. COMPARISON:  None Available. FINDINGS: Brain: No acute infarct, hemorrhage, or mass lesion is present. The ventricles are of normal size. Mild white matter changes are within normal limits for age. No significant extraaxial fluid collection is present. The internal auditory canals are within normal limits. The brainstem and cerebellum are within normal limits. Vascular: Flow is present in the major intracranial arteries. Skull and upper cervical spine: The craniocervical junction is normal. Upper cervical spine is within normal limits. Marrow signal is unremarkable. Sinuses/Orbits: The paranasal sinuses and mastoid air cells are clear. The globes and orbits are within normal limits. IMPRESSION: Normal MRI of the brain for age. No acute or focal lesion to explain the patient's  symptoms. Electronically Signed   By: San Morelle M.D.   On: 06/11/2021 16:32    Procedures Procedures    Medications Ordered in ED Medications  amLODipine (NORVASC) tablet 5 mg (has no administration in time range)    ED Course/ Medical Decision Making/ A&P                           Medical Decision Making Patient with known hypertension taking 3 anti-hypertension agents, presenting with dizziness characterized by spinning sensation.  Onset this morning.  No significant headache.  Able to ambulate.  No ataxia noted on exam.  Problems Addressed: Dizziness: acute illness or injury Hypertension, unspecified type: chronic illness or injury with exacerbation, progression,  or side effects of treatment    Details: New onset dizziness secondary to high blood pressure.  No stroke on MRI.  Amount and/or Complexity of Data Reviewed Independent Historian:     Details: She is a cogent historian Labs: ordered.    Details: CBC, be met-normal except sodium low, potassium low, chloride low, glucose high Radiology: ordered and independent interpretation performed.    Details: Chest x-ray, MRI brain-no acute abnormalities.  No CHF or pneumonia.  No evidence for stroke           Final Clinical Impression(s) / ED Diagnoses Final diagnoses:  Hypertension, unspecified type  Dizziness  Hypokalemia    Rx / DC Orders ED Discharge Orders          Ordered    amLODipine (NORVASC) 5 MG tablet  Daily        06/11/21 1734    potassium chloride SA (KLOR-CON M) 20 MEQ tablet  Daily        06/11/21 1738              Daleen Bo, MD 06/11/21 1739

## 2021-06-11 NOTE — ED Triage Notes (Signed)
Pt is experiencing high blood pressure with periods of dizziness that started this am.  History of hypertension, currently taking BP meds.

## 2021-06-11 NOTE — ED Notes (Signed)
Patient to MRI at this time.

## 2021-06-11 NOTE — Discharge Instructions (Addendum)
It appears that your dizziness is due to high blood pressure.  You are currently on 3 different blood pressure medicines and we are adding a fourth.  It is extremely important to watch your salt intake to help lower your blood pressure.  Follow-up with your doctor, this month as scheduled.  Return here for further evaluation and treatment if needed for further concerns or problems.  Start taking the blood pressure medicine that we added and sent to your pharmacy tomorrow.  Your potassium was also low and we sent a prescription to your pharmacy for that.  This is likely secondary to the hydrochlorothiazide medication you are taking.  Try to eat foods which contain more potassium.  See the attached information for that.  When you see your PCP later this month have them recheck your potassium level.

## 2022-04-23 ENCOUNTER — Other Ambulatory Visit (HOSPITAL_COMMUNITY): Payer: Self-pay | Admitting: Family Medicine

## 2022-04-23 DIAGNOSIS — Z1231 Encounter for screening mammogram for malignant neoplasm of breast: Secondary | ICD-10-CM

## 2022-05-31 ENCOUNTER — Other Ambulatory Visit (HOSPITAL_COMMUNITY): Payer: Self-pay | Admitting: Specialist

## 2022-05-31 DIAGNOSIS — M545 Low back pain, unspecified: Secondary | ICD-10-CM

## 2022-06-11 ENCOUNTER — Ambulatory Visit (HOSPITAL_COMMUNITY)
Admission: RE | Admit: 2022-06-11 | Discharge: 2022-06-11 | Disposition: A | Discharge status: Home / Self Care | Payer: Medicare Other | Source: Ambulatory Visit | Attending: Specialist | Admitting: Specialist

## 2022-06-11 DIAGNOSIS — M545 Low back pain, unspecified: Secondary | ICD-10-CM | POA: Diagnosis present

## 2022-08-12 ENCOUNTER — Emergency Department (HOSPITAL_COMMUNITY)
Admission: EM | Admit: 2022-08-12 | Discharge: 2022-08-12 | Discharge status: Against Medical Advice | Payer: Medicare Other | Source: Home / Self Care

## 2022-09-03 ENCOUNTER — Other Ambulatory Visit (HOSPITAL_COMMUNITY): Payer: Self-pay | Admitting: Family Medicine

## 2022-09-03 DIAGNOSIS — Z13820 Encounter for screening for osteoporosis: Secondary | ICD-10-CM

## 2022-09-16 ENCOUNTER — Encounter (HOSPITAL_COMMUNITY): Payer: Self-pay

## 2022-09-16 ENCOUNTER — Ambulatory Visit (HOSPITAL_COMMUNITY)
Admission: RE | Admit: 2022-09-16 | Discharge: 2022-09-16 | Disposition: A | Discharge status: Home / Self Care | Payer: Medicare Other | Source: Ambulatory Visit | Attending: Family Medicine | Admitting: Family Medicine

## 2022-09-16 DIAGNOSIS — Z780 Asymptomatic menopausal state: Secondary | ICD-10-CM | POA: Diagnosis not present

## 2022-09-16 DIAGNOSIS — Z1231 Encounter for screening mammogram for malignant neoplasm of breast: Secondary | ICD-10-CM | POA: Diagnosis present

## 2022-09-16 DIAGNOSIS — Z13820 Encounter for screening for osteoporosis: Secondary | ICD-10-CM | POA: Insufficient documentation

## 2023-05-18 ENCOUNTER — Other Ambulatory Visit (HOSPITAL_COMMUNITY): Payer: Self-pay | Admitting: Family Medicine

## 2023-05-18 DIAGNOSIS — Z122 Encounter for screening for malignant neoplasm of respiratory organs: Secondary | ICD-10-CM

## 2023-05-18 DIAGNOSIS — Z801 Family history of malignant neoplasm of trachea, bronchus and lung: Secondary | ICD-10-CM

## 2023-05-24 ENCOUNTER — Ambulatory Visit (HOSPITAL_COMMUNITY)

## 2023-06-01 ENCOUNTER — Other Ambulatory Visit: Payer: Self-pay

## 2023-06-01 ENCOUNTER — Ambulatory Visit (HOSPITAL_COMMUNITY): Attending: Neurosurgery

## 2023-06-01 ENCOUNTER — Encounter (HOSPITAL_COMMUNITY): Payer: Self-pay

## 2023-06-01 DIAGNOSIS — M545 Low back pain, unspecified: Secondary | ICD-10-CM | POA: Insufficient documentation

## 2023-06-01 DIAGNOSIS — R262 Difficulty in walking, not elsewhere classified: Secondary | ICD-10-CM | POA: Diagnosis present

## 2023-06-01 NOTE — Therapy (Addendum)
 OUTPATIENT PHYSICAL THERAPY THORACOLUMBAR EVALUATION   Patient Name: Jennifer Carey MRN: [id number] DOB:1955/05/07, 68 y.o., female Today's Date: 06/01/2023  END OF SESSION:  PT End of Session - 06/01/23 1528     Visit Number 1    Number of Visits 20    Date for PT Re-Evaluation 08/10/23    Authorization Type [employer] [facility] Medicare    Authorization Time Period no auth needed    PT Start Time 1436    PT Stop Time 1516    PT Time Calculation (min) 40 min    Activity Tolerance Patient tolerated treatment well    Behavior During Therapy [facility] for tasks assessed/performed             Past Medical History:  Diagnosis Date   Diabetes mellitus without complication (HCC)    Hypertension    Past Surgical History:  Procedure Laterality Date   arm surgery Left    SHOULDER SURGERY     Patient Active Problem List   Diagnosis Date Noted   Essential hypertension 08/26/2016   Encounter for routine gynecological examination with Papanicolaou smear of cervix 08/26/2016    PCP: Minus Amel, MD  REFERRING PROVIDER:   Garry Kansas, MD   REFERRING DIAG: [phone] (ICD-10-CM) - Spinal stenosis, lumbar region with neurogenic claudication   Rationale for Evaluation and Treatment: Rehabilitation  THERAPY DIAG:  Midline low back pain, unspecified chronicity, unspecified whether sciatica present  Difficulty in walking, not elsewhere classified  ONSET DATE: 03/2022  SUBJECTIVE:                                                                                                                                                                                           SUBJECTIVE STATEMENT: Started in March over a year ago and it got worse. Sometimes it's about a 7-8/10 and that at the end of the day and the first thing in the morning. Left leg more weak since March.   PERTINENT HISTORY: Attempted shots in May 2024; attempted medications for back pain; DM and HTN but under Ozempic     PAIN:  Are you having pain? Yes: NPRS scale: 7-8/10 Pain location: low back (middle lower back) - started with going down L LE (weakness noted) Pain description: aching and worse after working around a bit Aggravating factors: yard work, standing,  Relieving factors: sitting,  Difficulty with sleeping - 5-6 hours max  PRECAUTIONS: None  RED FLAGS: None   WEIGHT BEARING RESTRICTIONS: No  FALLS:  Has patient fallen in last 6 months? 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  DIAGNOSTIC FINDINGS:  See chart  PATIENT SURVEYS:  Modified Oswestry 18/50 - 36%   COGNITION: Overall cognitive status: Within functional limits for tasks assessed     SENSATION: WFL  MUSCLE LENGTH: Hamstrings: Right tightness noted deg; Left tightness noted deg  POSTURE: increased lumbar lordosis / thoracic kyphosis  PALPATION: Hypertonicity lumbar paraspinals bilaterally  LUMBAR ROM:   AROM eval  Flexion 60%  Extension 10%  Right lateral flexion 45%  Left lateral flexion 30% (pain increased)  Right rotation 40%  Left rotation 40%   (Blank rows = not tested)  LOWER EXTREMITY ROM:     Active  Right eval Left eval  Hip flexion    Hip extension    Hip abduction    Hip  adduction    Hip internal rotation    Hip external rotation    Knee flexion    Knee extension    Ankle dorsiflexion    Ankle plantarflexion    Ankle inversion    Ankle eversion     (Blank rows = not tested)  LOWER EXTREMITY MMT:    MMT Right eval Left eval  Hip flexion 4 4-  Hip extension 4- 4-  Hip abduction    Hip adduction    Hip internal rotation    Hip external rotation    Knee flexion 4 4-  Knee extension 4 4-  Ankle dorsiflexion 4 4  Ankle plantarflexion 4 4  Ankle inversion    Ankle eversion     (Blank rows = not tested)  LUMBAR SPECIAL TESTS:  FABER test: bilateral positive for tightness SLR passive - negative bilaterally   FUNCTIONAL TESTS:  5 times sit to stand: 16s 30s STS 9 repetitions without UE use; no increased pain  GAIT: - TEST NEXT VISIT Distance walked:  Assistive device utilized:  Level of assistance:  Comments:   TREATMENT DATE:  06/01/23 Eval and HEP prescription and Therapeutic Act with education on sxs and plan of care                                                                                                                               PATIENT EDUCATION:  Education details: Plan of care, diagnosis/prognosis with POC Person educated: Patient Education method: Explanation, Demonstration,  and Handouts Education comprehension: verbalized understanding and returned demonstration  HOME EXERCISE PROGRAM: Access Code: [email or web address] URL: [email or web address] Date: 06/01/2023 Prepared by: Leonard Raker  Exercises - Supine Lower Trunk Rotation  - 1 x daily - 7 x weekly - 2 sets - 10 reps - 2s hold - Supine Posterior Pelvic Tilt  - 1 x daily - 7 x weekly - 3 sets - 10 reps - 5s hold - Hooklying Single Knee to Chest Stretch  - 1 x daily - 7 x weekly - 2 sets - 6 reps - 10s hold  ASSESSMENT:  CLINICAL IMPRESSION: Patient is a 68 y.o. referred to PT for spinal stenosis who was seen today for physical therapy evaluation and  treatment for low back pain increased over the past year with L leg weakness noted. Pt demonstrates decreased L LE strength, poor core / proximal control and lumbopelvic controlled mobility, forward flexion preference with spinal alignment, and increased pain with standing > walking > sitting. Pt multiple co morbidities including HTN and DM with multiple facotrs and systems evaluated and stable/unchanging characteristics making Low complexity evaluation. Plan to see patient for skilled PT services secondary to functional testing indicating increasde risk for falls and decreased functional LE strength. Plan to see pt for 2x/wk for 10 weeks with good patient understanding.    OBJECTIVE IMPAIRMENTS: Abnormal gait, decreased activity tolerance, decreased balance, decreased knowledge of condition, decreased mobility, difficulty walking, decreased strength, hypomobility, and improper body mechanics.   ACTIVITY LIMITATIONS: carrying, standing, squatting, stairs, and bed mobility  PARTICIPATION LIMITATIONS: shopping, community activity, and yard work  PERSONAL FACTORS: 1-2 comorbidities: HTN and DM are also affecting patient's functional outcome.   REHAB POTENTIAL: Good  CLINICAL DECISION MAKING: Stable/uncomplicated  EVALUATION COMPLEXITY: Low   GOALS: Goals reviewed with patient? No  SHORT TERM GOALS: Target date: 08/10/23  Pt will be independent with HEP with compliance at least 60% of the time.  Baseline: prescribed Goal status: INITIAL  2.  Pt will report tolerance to walking > 20 minutes with < 4/10 pain in low back in order to indicate improved lumbopelvic control and global stability offloading spine with activity.  Baseline: increased pain with gait > 10 minutes Goal status: INITIAL  LONG TERM GOALS: Target date: 08/10/23  Pt will score > 12 repetitions in 30s chair stand test to indicate improved functional BLE strength. Baseline: 9 repetitions Goal status: INITIAL  2.  Pt will  report independence and participation in home making, ADL and community engagement with shopping without falling or reports of increased pain at the end of the day.  Baseline: increased pain with shopping therefore requires grocery cart  Goal status: INITIAL  3.  Pt will demonstrate proper floor to stand transfer as needed for recovery from fall with safety and independence.  Baseline: NT but reports need for increased time and use of support Goal status: INITIAL  4. Pt will be independent with HEP with compliance and progressions with participation in home gym program as appropriate. Baseline: prescribed Goal status: INITIAL  PLAN:  PT FREQUENCY: 2x/week  PT DURATION: 10 weeks  PLANNED INTERVENTIONS: 97110-Therapeutic exercises, 97530- Therapeutic activity, [id number]- Neuromuscular re-education, 97535- Self Care, [id number]- Manual therapy, [phone]- Gait training, Patient/Family education, Balance training, and Stair training.  PLAN FOR NEXT SESSION: goal review and adjustment; walk test; body mechanics with squatting; core/lumbopelvic controlled mobility interventions  Lavaun Porto, PT 06/01/2023, 3:52 PM     Lavaun Porto PT, DPT [facility] [place] [phone]  office

## 2023-06-10 ENCOUNTER — Encounter (HOSPITAL_COMMUNITY): Payer: Self-pay

## 2023-06-10 ENCOUNTER — Ambulatory Visit (HOSPITAL_COMMUNITY)

## 2023-06-10 DIAGNOSIS — M545 Low back pain, unspecified: Secondary | ICD-10-CM | POA: Diagnosis not present

## 2023-06-10 DIAGNOSIS — R262 Difficulty in walking, not elsewhere classified: Secondary | ICD-10-CM

## 2023-06-10 NOTE — Therapy (Signed)
 OUTPATIENT PHYSICAL THERAPY THORACOLUMBAR TREATMENT   Patient Name: Jennifer Carey MRN: [id number] DOB:September 09, 1955, 68 y.o., female Today's Date: 06/10/2023  END OF SESSION:  PT End of Session - 06/10/23 0800     Visit Number 2    Number of Visits 20    Date for PT Re-Evaluation 08/10/23    Authorization Type [employer] [facility] Medicare    Authorization Time Period no auth needed    PT Start Time 0801    PT Stop Time 0845    PT Time Calculation (min) 44 min    Activity Tolerance Patient tolerated treatment well    Behavior During Therapy [facility] for tasks assessed/performed             Past Medical History:  Diagnosis Date   Diabetes mellitus without complication (HCC)    Hypertension    Past Surgical History:  Procedure Laterality Date   arm surgery Left    SHOULDER SURGERY     Patient Active Problem List   Diagnosis Date Noted   Essential hypertension 08/26/2016   Encounter for routine gynecological examination with Papanicolaou smear of cervix 08/26/2016    PCP: Minus Amel, MD  REFERRING PROVIDER:   Garry Kansas, MD   REFERRING DIAG: [phone] (ICD-10-CM) - Spinal stenosis, lumbar region with neurogenic claudication   Rationale for Evaluation and Treatment: Rehabilitation  THERAPY DIAG:  Midline low back pain, unspecified chronicity, unspecified whether sciatica present  Difficulty in walking, not elsewhere classified  ONSET DATE: 03/2022  SUBJECTIVE:                                                                                                                                                                                           SUBJECTIVE STATEMENT: 06/10/23:  Pain scale 7/10 that is worse in the afternoon, increases with activity.  Feels the weather plays a part in the pain.  Has began the exercises at home without questions.  Eval:  Started in March over a year ago and it got worse. Sometimes it's about a 7-8/10 and that at the end of the  day and the first thing in the morning. Left leg more weak since March.   PERTINENT HISTORY: Attempted shots in May 2024; attempted medications for back pain; DM and HTN but under Ozempic    PAIN:  Are you having pain? Yes: NPRS scale: 7-8/10 Pain location: low back (middle lower back) - started with going down L LE (weakness noted) Pain description: aching and worse after working around a bit Aggravating factors: yard work, standing,  Relieving factors: sitting,  Difficulty with sleeping - 5-6 hours max  PRECAUTIONS:  None  RED FLAGS: None   WEIGHT BEARING RESTRICTIONS: No  FALLS:  Has patient fallen in last 6 months? 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  DIAGNOSTIC FINDINGS:  See chart  PATIENT SURVEYS:  Modified Oswestry 18/50 - 36%   COGNITION: Overall cognitive status: Within functional limits for tasks assessed     SENSATION: WFL  MUSCLE LENGTH: Hamstrings: Right tightness noted deg; Left tightness noted deg  POSTURE: increased lumbar lordosis / thoracic kyphosis  PALPATION: Hypertonicity lumbar paraspinals bilaterally  LUMBAR ROM:   AROM eval  Flexion 60%  Extension 10%  Right lateral flexion 45%  Left lateral flexion 30% (pain increased)   Right rotation 40%  Left rotation 40%   (Blank rows = not tested)  LOWER EXTREMITY ROM:     Active  Right 06/10/23 Left 06/10/23  Hip flexion    Hip extension    Hip abduction    Hip adduction    Hip internal rotation    Hip external rotation    Knee flexion 112 118  Knee extension Lacking 9 degrees Lacking 4 degrees  Ankle dorsiflexion    Ankle plantarflexion    Ankle inversion    Ankle eversion     (Blank rows = not tested)  LOWER EXTREMITY MMT:    MMT Right eval Left eval  Hip flexion 4 4-  Hip extension 4- 4-  Hip abduction    Hip adduction    Hip internal rotation    Hip external rotation    Knee flexion 4 4-  Knee extension 4 4-  Ankle dorsiflexion 4 4  Ankle plantarflexion 4 4  Ankle inversion    Ankle eversion     (Blank rows = not tested)  LUMBAR SPECIAL TESTS:  FABER test: bilateral positive for tightness SLR passive - negative bilaterally   FUNCTIONAL TESTS:  5 times sit to stand: 16s 30s STS 9 repetitions without UE use; no increased pain  GAIT: - 06/10/23: 440ft no AD Distance walked:  Assistive device utilized:  Level of assistance:  Comments:   TREATMENT DATE:  06/10/23: Reviewed goals Educated importance of compliance with HEP for maximal benefits 428ft no AD  Supine: Bed mobility Pelvic tilt 10x 10" SKTC 3x 30" LTR 5x 10" Decompression 2-5 5x 5" Knee ROM measurements Hamstrings stretch 3x 30"    06/01/23 Eval and HEP prescription and Therapeutic Act with education on sxs and plan of care  PATIENT EDUCATION:  Education details: Plan of care, diagnosis/prognosis with POC Person educated: Patient Education method: Explanation, Demonstration, and Handouts Education comprehension: verbalized understanding and returned demonstration  HOME EXERCISE PROGRAM: Access Code: [email or web address] URL:  [email or web address] Date: 06/01/2023 Prepared by: Leonard Raker  Exercises - Supine Lower Trunk Rotation  - 1 x daily - 7 x weekly - 2 sets - 10 reps - 2s hold - Supine Posterior Pelvic Tilt  - 1 x daily - 7 x weekly - 3 sets - 10 reps - 5s hold - Hooklying Single Knee to Chest Stretch  - 1 x daily - 7 x weekly - 2 sets - 6 reps - 10s hold  06/10/23: - Hooklying Hamstring Stretch with Strap  - 2 x daily - 7 x weekly - 1 sets - 3 reps - 30" hold  ASSESSMENT:  CLINICAL IMPRESSION: 06/10/23:  Reviewed goals and educated importance of HEP compliance for maximal benefits.  Pt able to recall 2 out 3 exercises and able to demonstrate correct form.  Therapist printed out current exercise program and reviewed form/mechanics.  complete with ability to ambulate 441ft with no LOB, pt ambulated with trunk flexed posture and no reports of increased pain following walking.  Pt educated importance of posture for pain control.  Reviewed bed mobility to reduce stress on lumbar mm.  Added decompression for posture and posterior chain strengthening that was tolerated well, demonstration and tactile cueing to assure correct mechanics.  Noted extension lag Bil knees.  Pt given decompression exercises to add to HEP with printout given and verbalized understanding.  Eval:  Patient is a 68 y.o. referred to PT for spinal stenosis who was seen today for physical therapy evaluation and treatment for low back pain increased over the past year with L leg weakness noted. Pt demonstrates decreased L LE strength, poor core / proximal control and lumbopelvic controlled mobility, forward flexion preference with spinal alignment, and increased pain with standing > walking > sitting. Pt multiple co morbidities including HTN and DM with multiple facotrs and systems evaluated and stable/unchanging characteristics making Low complexity evaluation. Plan to see patient for skilled PT services secondary to functional  testing indicating increasde risk for falls and decreased functional LE strength. Plan to see pt for 2x/wk for 10 weeks with good patient understanding.    OBJECTIVE IMPAIRMENTS: Abnormal gait, decreased activity tolerance, decreased balance, decreased knowledge of condition, decreased mobility, difficulty walking, decreased strength, hypomobility, and improper body mechanics.   ACTIVITY LIMITATIONS: carrying, standing, squatting, stairs, and bed mobility  PARTICIPATION LIMITATIONS: shopping, community activity, and yard work  PERSONAL FACTORS: 1-2 comorbidities: HTN and DM are also affecting patient's functional outcome.   REHAB POTENTIAL: Good  CLINICAL DECISION MAKING: Stable/uncomplicated  EVALUATION COMPLEXITY: Low   GOALS: Goals reviewed with patient? No  SHORT TERM GOALS: Target date: 08/10/23  Pt will be independent with HEP with compliance at least 60% of the time.  Baseline: prescribed Goal status: INITIAL  2.  Pt will report tolerance to walking > 20 minutes with < 4/10 pain in low back in order to indicate improved lumbopelvic control and global stability offloading spine with activity.  Baseline: increased pain with gait > 10 minutes Goal status: INITIAL  LONG TERM GOALS: Target date: 08/10/23  Pt will score > 12 repetitions in 30s chair stand test to indicate improved functional BLE strength. Baseline: 9 repetitions Goal status: INITIAL  2.  Pt will report independence and participation in home making, ADL and community engagement with  shopping without falling or reports of increased pain at the end of the day.  Baseline: increased pain with shopping therefore requires grocery cart  Goal status: INITIAL  3.  Pt will demonstrate proper floor to stand transfer as needed for recovery from fall with safety and independence.  Baseline: NT but reports need for increased time and use of support Goal status: INITIAL  4. Pt will be independent with HEP with compliance  and progressions with participation in home gym program as appropriate. Baseline: prescribed Goal status: INITIAL  PLAN:  PT FREQUENCY: 2x/week  PT DURATION: 10 weeks  PLANNED INTERVENTIONS: 97110-Therapeutic exercises, 97530- Therapeutic activity, [id number]- Neuromuscular re-education, 97535- Self Care, 16109- Manual therapy, [phone]- Gait training, Patient/Family education, Balance training, and Stair training.  PLAN FOR NEXT SESSION: body mechanics with squatting; core/lumbopelvic controlled mobility interventions  Minor Amble, LPTA/CLT; CBIS [phone]  Alesia Anchors, PTA 06/10/2023, 3:59 PM

## 2023-06-13 ENCOUNTER — Ambulatory Visit (HOSPITAL_COMMUNITY): Attending: Physical Medicine and Rehabilitation | Admitting: Physical Therapy

## 2023-06-13 DIAGNOSIS — M545 Low back pain, unspecified: Secondary | ICD-10-CM | POA: Insufficient documentation

## 2023-06-13 DIAGNOSIS — R262 Difficulty in walking, not elsewhere classified: Secondary | ICD-10-CM | POA: Diagnosis present

## 2023-06-13 NOTE — Therapy (Signed)
 OUTPATIENT PHYSICAL THERAPY THORACOLUMBAR TREATMENT   Patient Name: Jennifer Carey MRN: [id number] DOB:Jul 25, 1955, 68 y.o., female Today's Date: 06/13/2023  END OF SESSION:  PT End of Session - 06/13/23 1102     Visit Number 3    Number of Visits 20    Date for PT Re-Evaluation 08/10/23    Authorization Type [employer] Cross [facility] Medicare    Authorization Time Period no auth needed    PT Start Time 1015    PT Stop Time 1100    PT Time Calculation (min) 45 min    Activity Tolerance Patient tolerated treatment well    Behavior During Therapy [facility] for tasks assessed/performed              Past Medical History:  Diagnosis Date   Diabetes mellitus without complication (HCC)    Hypertension    Past Surgical History:  Procedure Laterality Date   arm surgery Left    SHOULDER SURGERY     Patient Active Problem List   Diagnosis Date Noted   Essential hypertension 08/26/2016   Encounter for routine gynecological examination with Papanicolaou smear of cervix 08/26/2016    PCP: Minus Amel, MD  REFERRING PROVIDER:   Garry Kansas, MD   REFERRING DIAG: [phone] (ICD-10-CM) - Spinal stenosis, lumbar region with neurogenic claudication   Rationale for Evaluation and Treatment: Rehabilitation  THERAPY DIAG:  Midline low back pain, unspecified chronicity, unspecified whether sciatica present  Difficulty in walking, not elsewhere classified  ONSET DATE: 03/2022  SUBJECTIVE:                                                                                                                                                                                           SUBJECTIVE STATEMENT: 06/13/23: Pain gets worse in the evenings and first thing in the morning.  Currently 6-7/10.  Reports compliance with HEP.  Eval:  Started in March over a year ago and it got worse. Sometimes it's about a 7-8/10 and that at the end of the day and the first thing in the morning. Left leg more weak  since March.   PERTINENT HISTORY: Attempted shots in May 2024; attempted medications for back pain; DM and HTN but under Ozempic    PAIN:  Are you having pain? Yes: NPRS scale: 7-8/10 Pain location: low back (middle lower back) - started with going down L LE (weakness noted) Pain description: aching and worse after working around a bit Aggravating factors: yard work, standing,  Relieving factors: sitting,  Difficulty with sleeping - 5-6 hours max  PRECAUTIONS: None  RED FLAGS: None   WEIGHT BEARING RESTRICTIONS: No  FALLS:  Has patient fallen in last 6 months? 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  DIAGNOSTIC FINDINGS:  See chart  PATIENT SURVEYS:  Modified Oswestry 18/50 - 36%   COGNITION: Overall cognitive status: Within functional limits for tasks assessed     SENSATION: WFL  MUSCLE LENGTH: Hamstrings: Right tightness noted deg; Left tightness noted deg  POSTURE: increased lumbar lordosis / thoracic kyphosis  PALPATION: Hypertonicity lumbar paraspinals bilaterally  LUMBAR ROM:   AROM eval  Flexion 60%  Extension 10%  Right lateral flexion 45%  Left lateral flexion 30% (pain increased)  Right rotation 40%  Left rotation 40%   (Blank rows =  not tested)  LOWER EXTREMITY ROM:     Active  Right 06/10/23 Left 06/10/23  Hip flexion    Hip extension    Hip abduction    Hip adduction    Hip internal rotation    Hip external rotation    Knee flexion 112 118  Knee extension Lacking 9 degrees Lacking 4 degrees  Ankle dorsiflexion    Ankle plantarflexion    Ankle inversion    Ankle eversion     (Blank rows = not tested)  LOWER EXTREMITY MMT:    MMT Right eval Left eval  Hip flexion 4 4-  Hip extension 4- 4-  Hip abduction    Hip adduction    Hip internal rotation    Hip external rotation    Knee flexion 4 4-  Knee extension 4 4-  Ankle dorsiflexion 4 4  Ankle plantarflexion 4 4  Ankle inversion    Ankle eversion     (Blank rows = not tested)  LUMBAR SPECIAL TESTS:  FABER test: bilateral positive for tightness SLR passive - negative bilaterally   FUNCTIONAL TESTS:  5 times sit to stand: 16s 30s STS 9 repetitions without UE use; no increased pain  GAIT: - 06/10/23: 428ft no AD Distance walked:  Assistive device utilized:  Level of assistance:  Comments:   TREATMENT DATE:  06/13/23 Supine: decompression exercises 2-5 5X5"  Pelvic tilt 10X10"  SKTC 5X15" each LE  LTR 10X5" each  Hamstring stretch with strap 3X15" each  Glute set 10X5" (bridge too painful)  SLR Sidelying hip abuction Prone: Heelsqueezes  Prone on elbows 2 minutes Standing hip excursions 10X each direction   06/10/23: Reviewed goals Educated importance of compliance with HEP for maximal benefits 460ft no AD  Supine: Bed mobility Pelvic tilt 10x 10" SKTC 3x 30" LTR 5x 10" Decompression 2-5 5x 5" Knee ROM measurements Hamstrings stretch 3x 30"    06/01/23 Eval and HEP prescription and Therapeutic Act with education on sxs and plan of care  PATIENT EDUCATION:  Education details: Plan of care,  diagnosis/prognosis with POC Person educated: Patient Education method: Explanation, Demonstration, and Handouts Education comprehension: verbalized understanding and returned demonstration  HOME EXERCISE PROGRAM: Access Code: [email or web address] URL: [email or web address] Date: 06/01/2023 Prepared by: Leonard Raker  Exercises - Supine Lower Trunk Rotation  - 1 x daily - 7 x weekly - 2 sets - 10 reps - 2s hold - Supine Posterior Pelvic Tilt  - 1 x daily - 7 x weekly - 3 sets - 10 reps - 5s hold - Hooklying Single Knee to Chest Stretch  - 1 x daily - 7 x weekly - 2 sets - 6 reps - 10s hold  06/10/23: - Hooklying Hamstring Stretch with Strap  - 2 x daily - 7 x weekly - 1 sets - 3 reps - 30" hold  ASSESSMENT:  CLINICAL IMPRESSION: Continued with established exercises with cues needed for general form and hold times. Pt with difficulty completing hip abduction in correct form as tends to turn LE and use hip flexors.  Manual cues needed to correct.  Pt unable to complete bridge due to glut weakness so began with glut sets.  Pt able to tolerate prone position well and began with prone on elbows and heel squeezes.  Glut too weak to complete hip extension as body rotates with attempts.  PPt with several questions today throughout session concerning her progression and areas of weakness.  Encouraged to begin a walking program.  Hip excursions began and added to HEP as well. Continues to present with trunk flexed posture so incorporated therex to help correct this.   Eval:  Patient is a 68 y.o. referred to PT for spinal stenosis who was seen today for physical therapy evaluation and treatment for low back pain increased over the past year with L leg weakness noted. Pt demonstrates decreased L LE strength, poor core / proximal control and lumbopelvic controlled mobility, forward flexion preference with spinal alignment, and increased pain with standing > walking > sitting. Pt multiple co morbidities  including HTN and DM with multiple facotrs and systems evaluated and stable/unchanging characteristics making Low complexity evaluation. Plan to see patient for skilled PT services secondary to functional testing indicating increasde risk for falls and decreased functional LE strength. Plan to see pt for 2x/wk for 10 weeks with good patient understanding.    OBJECTIVE IMPAIRMENTS: Abnormal gait, decreased activity tolerance, decreased balance, decreased knowledge of condition, decreased mobility, difficulty walking, decreased strength, hypomobility, and improper body mechanics.   ACTIVITY LIMITATIONS: carrying, standing, squatting, stairs, and bed mobility  PARTICIPATION LIMITATIONS: shopping, community activity, and yard work  PERSONAL FACTORS: 1-2 comorbidities: HTN and DM are also affecting patient's functional outcome.   REHAB POTENTIAL: Good  CLINICAL DECISION MAKING: Stable/uncomplicated  EVALUATION COMPLEXITY: Low   GOALS: Goals reviewed with patient? No  SHORT TERM GOALS: Target date: 08/10/23  Pt will be independent with HEP with compliance at least 60% of the time.  Baseline: prescribed Goal status: INITIAL  2.  Pt will report tolerance to walking > 20 minutes with < 4/10 pain in low back in order to indicate improved lumbopelvic control and global stability offloading spine with activity.  Baseline: increased pain with gait > 10 minutes Goal status: INITIAL  LONG TERM GOALS: Target date: 08/10/23  Pt will score > 12 repetitions in 30s chair stand test to indicate improved functional BLE strength. Baseline: 9 repetitions Goal status: INITIAL  2.  Pt will report independence and participation in home making,  ADL and community engagement with shopping without falling or reports of increased pain at the end of the day.  Baseline: increased pain with shopping therefore requires grocery cart  Goal status: INITIAL  3.  Pt will demonstrate proper floor to stand transfer as  needed for recovery from fall with safety and independence.  Baseline: NT but reports need for increased time and use of support Goal status: INITIAL  4. Pt will be independent with HEP with compliance and progressions with participation in home gym program as appropriate. Baseline: prescribed Goal status: INITIAL  PLAN:  PT FREQUENCY: 2x/week  PT DURATION: 10 weeks  PLANNED INTERVENTIONS: 97110-Therapeutic exercises, 97530- Therapeutic activity, [id number]- Neuromuscular re-education, 97535- Self Care, 16109- Manual therapy, [phone]- Gait training, Patient/Family education, Balance training, and Stair training.  PLAN FOR NEXT SESSION: body mechanics with squatting; core/lumbopelvic controlled mobility interventions  Lorenso Romance, PTA/CLT [facility] Outpatient Rehabilitation [facility] Ph: [phone]  Lorenso Romance, PTA 06/13/2023, 11:02 AM

## 2023-06-15 ENCOUNTER — Encounter (HOSPITAL_COMMUNITY)

## 2023-06-20 ENCOUNTER — Ambulatory Visit (HOSPITAL_COMMUNITY)

## 2023-06-20 DIAGNOSIS — R262 Difficulty in walking, not elsewhere classified: Secondary | ICD-10-CM

## 2023-06-20 DIAGNOSIS — M545 Low back pain, unspecified: Secondary | ICD-10-CM

## 2023-06-20 NOTE — Therapy (Signed)
 OUTPATIENT PHYSICAL THERAPY THORACOLUMBAR TREATMENT   Patient Name: Jennifer Carey MRN: [id number] DOB:1955/07/24, 68 y.o., female Today's Date: 06/20/2023  END OF SESSION:  PT End of Session - 06/20/23 1152     Visit Number 4    Number of Visits 20    Date for PT Re-Evaluation 08/10/23    Authorization Type Blue Cross [facility] Medicare    Authorization Time Period no auth needed    PT Start Time 1151    PT Stop Time 1230    PT Time Calculation (min) 39 min    Activity Tolerance Patient tolerated treatment well    Behavior During Therapy [facility] for tasks assessed/performed              Past Medical History:  Diagnosis Date   Diabetes mellitus without complication (HCC)    Hypertension    Past Surgical History:  Procedure Laterality Date   arm surgery Left    SHOULDER SURGERY     Patient Active Problem List   Diagnosis Date Noted   Essential hypertension 08/26/2016   Encounter for routine gynecological examination with Papanicolaou smear of cervix 08/26/2016    PCP: Minus Amel, MD  REFERRING PROVIDER:   Garry Kansas, MD   REFERRING DIAG: [phone] (ICD-10-CM) - Spinal stenosis, lumbar region with neurogenic claudication   Rationale for Evaluation and Treatment: Rehabilitation  THERAPY DIAG:  Midline low back pain, unspecified chronicity, unspecified whether sciatica present  Difficulty in walking, not elsewhere classified  ONSET DATE: 03/2022  SUBJECTIVE:                                                                                                                                                                                           SUBJECTIVE STATEMENT: Worse pain today 8-9/10; worked out in the yard on Tuesday and that's why she cancelled on Wed.  She did try to do some of her exercises at home but seemed to make it worse so discontinued.  Bending back seems to make it worse  Eval:  Started in March over a year ago and it got worse. Sometimes  it's about a 7-8/10 and that at the end of the day and the first thing in the morning. Left leg more weak since March.   PERTINENT HISTORY: Attempted shots in May 2024; attempted medications for back pain; DM and HTN but under Ozempic    PAIN:  Are you having pain? Yes: NPRS scale: 7-8/10 Pain location: low back (middle lower back) - started with going down L LE (weakness noted) Pain description: aching and worse after working around a bit Aggravating factors: yard work, standing,  Relieving factors: sitting,  Difficulty with sleeping - 5-6 hours max  PRECAUTIONS: None  RED FLAGS: None   WEIGHT BEARING RESTRICTIONS: No  FALLS:  Has patient fallen in last 6 months? 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  DIAGNOSTIC FINDINGS:  See chart  PATIENT SURVEYS:  Modified Oswestry 18/50 - 36%   COGNITION: Overall cognitive status: Within functional limits for tasks assessed     SENSATION: WFL  MUSCLE LENGTH: Hamstrings: Right tightness noted deg; Left tightness noted deg  POSTURE: increased lumbar lordosis / thoracic kyphosis  PALPATION: Hypertonicity lumbar paraspinals bilaterally  LUMBAR ROM:   AROM eval  Flexion 60%  Extension 10%  Right lateral flexion  45%  Left lateral flexion 30% (pain increased)  Right rotation 40%  Left rotation 40%   (Blank rows = not tested)  LOWER EXTREMITY ROM:     Active  Right 06/10/23 Left 06/10/23  Hip flexion    Hip extension    Hip abduction    Hip adduction    Hip internal rotation    Hip external rotation    Knee flexion 112 118  Knee extension Lacking 9 degrees Lacking 4 degrees  Ankle dorsiflexion    Ankle plantarflexion    Ankle inversion    Ankle eversion     (Blank rows = not tested)  LOWER EXTREMITY MMT:    MMT Right eval Left eval  Hip flexion 4 4-  Hip extension 4- 4-  Hip abduction    Hip adduction    Hip internal rotation    Hip external rotation    Knee flexion 4 4-  Knee extension 4 4-  Ankle dorsiflexion 4 4  Ankle plantarflexion 4 4  Ankle inversion    Ankle eversion     (Blank rows = not tested)  LUMBAR SPECIAL TESTS:  FABER test: bilateral positive for tightness SLR passive - negative bilaterally   FUNCTIONAL TESTS:  5 times sit to stand: 16s 30s STS 9 repetitions without UE use; no increased pain  GAIT: - 06/10/23: 454ft no AD Distance walked:  Assistive device utilized:  Level of assistance:  Comments:   TREATMENT DATE:  06/20/23 Supine: Decompression position with wedge and moist heat to low back x 5' and education on breathing technique for relaxation Decompression exercises 2-5; 5" hold x 8 reps each SKTC 20" hold x 3 each Active hamstring stretch 3 x 20" each LTR 5" hold x 10 each Abdominal bracing 5" hold x 10       06/13/23 Supine: decompression exercises 2-5 5X5"  Pelvic tilt 10X10"  SKTC 5X15" each LE  LTR 10X5" each  Hamstring stretch with strap 3X15" each  Glute set 10X5" (bridge too painful)  SLR Sidelying hip abuction Prone: Heelsqueezes  Prone on elbows  2 minutes Standing hip excursions 10X each direction   06/10/23: Reviewed goals Educated importance of compliance with HEP for maximal benefits 476ft no  AD  Supine: Bed mobility Pelvic tilt 10x 10" SKTC 3x 30" LTR 5x 10" Decompression 2-5 5x 5" Knee ROM measurements Hamstrings stretch 3x 30"    06/01/23 Eval and HEP prescription and Therapeutic Act with education on sxs and plan of care                                                                                                                               PATIENT EDUCATION:  Education details: Plan of care, diagnosis/prognosis with POC Person educated: Patient Education method: Explanation, Demonstration, and Handouts Education comprehension: verbalized understanding and returned demonstration  HOME EXERCISE PROGRAM: Access Code: [email or web address] URL: [email or web address] Date: 06/01/2023 Prepared by: Leonard Raker  Exercises - Supine Lower Trunk Rotation  - 1 x daily - 7 x weekly - 2 sets - 10 reps - 2s hold - Supine Posterior Pelvic Tilt  - 1 x daily - 7 x weekly - 3 sets - 10 reps - 5s hold - Hooklying Single Knee to Chest Stretch  - 1 x daily - 7 x weekly - 2 sets - 6 reps - 10s hold  06/10/23: - Hooklying Hamstring Stretch with Strap  - 2 x daily - 7 x weekly - 1 sets - 3 reps - 30" hold  ASSESSMENT:  CLINICAL IMPRESSION: Patient presents with increased pain levels today so started with decompression positioning with moist heat and education on relaxation breathing technique.   Continued with decompression exercises. Needs cues for technique during decompression exercises.  Continues to present with trunk flexed posture. Feels that her left leg is weaker than her right with all exercises. Updated HEP.  Decreased intensity today due to increased pain levels.  Patient will benefit from continued skilled therapy services to address deficits and promote return to optimal function.        Eval:  Patient is a 68 y.o. referred to PT for spinal stenosis who was seen today for physical therapy evaluation and treatment for low back pain increased over the past year  with L leg weakness noted. Pt demonstrates decreased L LE strength, poor core / proximal control and lumbopelvic controlled mobility, forward flexion preference with spinal alignment, and increased pain with standing > walking > sitting. Pt multiple co morbidities including HTN and DM with multiple facotrs and systems evaluated and stable/unchanging characteristics making Low complexity evaluation. Plan to see patient for skilled PT services secondary to functional testing indicating increasde risk for falls and decreased functional LE strength. Plan to see pt for 2x/wk for 10 weeks with good patient understanding.    OBJECTIVE IMPAIRMENTS: Abnormal gait, decreased activity tolerance, decreased balance, decreased knowledge of condition, decreased mobility, difficulty walking, decreased strength, hypomobility, and improper body mechanics.   ACTIVITY LIMITATIONS: carrying, standing, squatting, stairs, and bed mobility  PARTICIPATION LIMITATIONS: shopping,  community activity, and yard work  PERSONAL FACTORS: 1-2 comorbidities: HTN and DM are also affecting patient's functional outcome.   REHAB POTENTIAL: Good  CLINICAL DECISION MAKING: Stable/uncomplicated  EVALUATION COMPLEXITY: Low   GOALS: Goals reviewed with patient? No  SHORT TERM GOALS: Target date: 08/10/23  Pt will be independent with HEP with compliance at least 60% of the time.  Baseline: prescribed Goal status: INITIAL  2.  Pt will report tolerance to walking > 20 minutes with < 4/10 pain in low back in order to indicate improved lumbopelvic control and global stability offloading spine with activity.  Baseline: increased pain with gait > 10 minutes Goal status: INITIAL  LONG TERM GOALS: Target date: 08/10/23  Pt will score > 12 repetitions in 30s chair stand test to indicate improved functional BLE strength. Baseline: 9 repetitions Goal status: INITIAL  2.  Pt will report independence and participation in home making, ADL  and community engagement with shopping without falling or reports of increased pain at the end of the day.  Baseline: increased pain with shopping therefore requires grocery cart  Goal status: INITIAL  3.  Pt will demonstrate proper floor to stand transfer as needed for recovery from fall with safety and independence.  Baseline: NT but reports need for increased time and use of support Goal status: INITIAL  4. Pt will be independent with HEP with compliance and progressions with participation in home gym program as appropriate. Baseline: prescribed Goal status: INITIAL  PLAN:  PT FREQUENCY: 2x/week  PT DURATION: 10 weeks  PLANNED INTERVENTIONS: 97110-Therapeutic exercises, 97530- Therapeutic activity, [id number]- Neuromuscular re-education, 97535- Self Care, 95621- Manual therapy, [phone]- Gait training, Patient/Family education, Balance training, and Stair training.  PLAN FOR NEXT SESSION: body mechanics with squatting; core/lumbopelvic controlled mobility interventions 12:29 PM, 06/20/23 Jamey Demchak Small Dijon Kohlman MPT [facility] [place] [phone] Ph:[phone]

## 2023-06-22 ENCOUNTER — Encounter (HOSPITAL_COMMUNITY): Payer: Self-pay

## 2023-06-22 ENCOUNTER — Ambulatory Visit (HOSPITAL_COMMUNITY)

## 2023-06-22 DIAGNOSIS — M545 Low back pain, unspecified: Secondary | ICD-10-CM | POA: Diagnosis not present

## 2023-06-22 DIAGNOSIS — R262 Difficulty in walking, not elsewhere classified: Secondary | ICD-10-CM

## 2023-06-22 NOTE — Therapy (Signed)
 OUTPATIENT PHYSICAL THERAPY THORACOLUMBAR TREATMENT   Patient Name: Jennifer Carey MRN: [id number] DOB:May 23, 1955, 68 y.o., female Today's Date: 06/22/2023  END OF SESSION:  PT End of Session - 06/22/23 1013     Visit Number 5    Number of Visits 20    Date for PT Re-Evaluation 08/10/23    Authorization Type Blue Cross [facility] Medicare    Authorization Time Period no auth needed    PT Start Time 1015    PT Stop Time 1055    PT Time Calculation (min) 40 min    Activity Tolerance Patient tolerated treatment well;Patient limited by pain;No increased pain    Behavior During Therapy [facility] for tasks assessed/performed              Past Medical History:  Diagnosis Date   Diabetes mellitus without complication (HCC)    Hypertension    Past Surgical History:  Procedure Laterality Date   arm surgery Left    SHOULDER SURGERY     Patient Active Problem List   Diagnosis Date Noted   Essential hypertension 08/26/2016   Encounter for routine gynecological examination with Papanicolaou smear of cervix 08/26/2016    PCP: Minus Amel, MD  REFERRING PROVIDER:   Garry Kansas, MD   REFERRING DIAG: [phone] (ICD-10-CM) - Spinal stenosis, lumbar region with neurogenic claudication   Rationale for Evaluation and Treatment: Rehabilitation  THERAPY DIAG:  Midline low back pain, unspecified chronicity, unspecified whether sciatica present  Difficulty in walking, not elsewhere classified  ONSET DATE: 03/2022  SUBJECTIVE:                                                                                                                                                                                           SUBJECTIVE STATEMENT: Pt reports she has has increased pain for the last week following 6 houses of yardwork, current pain scale 8/10.  Eval:  Started in March over a year ago and it got worse. Sometimes it's about a 7-8/10 and that at the end of the day and the first thing  in the morning. Left leg more weak since March.   PERTINENT HISTORY: Attempted shots in May 2024; attempted medications for back pain; DM and HTN but under Ozempic    PAIN:  Are you having pain? Yes: NPRS scale: 7-8/10 Pain location: low back (middle lower back) - started with going down L LE (weakness noted) Pain description: aching and worse after working around a bit Aggravating factors: yard work, standing,  Relieving factors: sitting,  Difficulty with sleeping - 5-6 hours max  PRECAUTIONS: None  RED FLAGS: None  WEIGHT BEARING RESTRICTIONS: No  FALLS:  Has patient fallen in last 6 months? 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  DIAGNOSTIC FINDINGS:  See chart  PATIENT SURVEYS:  Modified Oswestry 18/50 - 36%   COGNITION: Overall cognitive status: Within functional limits for tasks assessed     SENSATION: WFL  MUSCLE LENGTH: Hamstrings: Right tightness noted deg; Left tightness noted deg  POSTURE: increased lumbar lordosis / thoracic kyphosis  PALPATION: Hypertonicity lumbar paraspinals bilaterally  LUMBAR ROM:   AROM eval  Flexion 60%  Extension 10%  Right lateral flexion 45%  Left lateral flexion 30% (pain increased)  Right rotation 40%   Left rotation 40%   (Blank rows = not tested)  LOWER EXTREMITY ROM:     Active  Right 06/10/23 Left 06/10/23  Hip flexion    Hip extension    Hip abduction    Hip adduction    Hip internal rotation    Hip external rotation    Knee flexion 112 118  Knee extension Lacking 9 degrees Lacking 4 degrees  Ankle dorsiflexion    Ankle plantarflexion    Ankle inversion    Ankle eversion     (Blank rows = not tested)  LOWER EXTREMITY MMT:    MMT Right eval Left eval  Hip flexion 4 4-  Hip extension 4- 4-  Hip abduction    Hip adduction    Hip internal rotation    Hip external rotation    Knee flexion 4 4-  Knee extension 4 4-  Ankle dorsiflexion 4 4  Ankle plantarflexion 4 4  Ankle inversion    Ankle eversion     (Blank rows = not tested)  LUMBAR SPECIAL TESTS:  FABER test: bilateral positive for tightness SLR passive - negative bilaterally   FUNCTIONAL TESTS:  5 times sit to stand: 16s 30s STS 9 repetitions without UE use; no increased pain  GAIT: - 06/10/23: 423ft no AD Distance walked:  Assistive device utilized:  Level of assistance:  Comments:   TREATMENT DATE:  06/22/23: Supine:  Reviewed bed mobility  Deep breathing to calm CNS x , cueing to reduce accessory breathing  SKTC 2x 30  Posterior pelvic tilt paired with exhale x 1 min  Decompression 2-5 5x 5  Decompression UE movements RTB 5x 5 (may need to review next session) Seated:  Educated importance of seated posture for back control  Use of lumbar support  Seated rows with RTB 10x 5  06/20/23 Supine: Decompression position with wedge and moist heat to low back x 5' and education on breathing technique for relaxation Decompression exercises 2-5; 5 hold x 8 reps each SKTC 20 hold x 3 each Active hamstring stretch 3 x 20 each LTR 5 hold x 10 each Abdominal bracing  5 hold x 10       06/13/23 Supine: decompression exercises 2-5 5X5  Pelvic tilt 10X10  SKTC 5X15 each  LE  LTR 10X5 each  Hamstring stretch with strap 3X15 each  Glute set 10X5 (bridge too painful)  SLR Sidelying hip abuction Prone: Heelsqueezes  Prone on elbows 2 minutes Standing hip excursions 10X each direction   06/10/23: Reviewed goals Educated importance of compliance with HEP for maximal benefits 41ft no AD  Supine: Bed mobility Pelvic tilt 10x 10 SKTC 3x 30 LTR 5x 10 Decompression 2-5 5x 5 Knee ROM measurements Hamstrings stretch 3x 30    06/01/23 Eval and HEP prescription and Therapeutic Act with education on sxs and plan of care                                                                                                                               PATIENT EDUCATION:  Education details: Plan of care, diagnosis/prognosis with POC Person educated: Patient Education method: Explanation, Demonstration, and Handouts Education comprehension: verbalized understanding and returned demonstration  HOME EXERCISE PROGRAM: Access Code: [email or web address] URL: [email or web address] Date: 06/01/2023 Prepared by: Leonard Raker  Exercises - Supine Lower Trunk Rotation  - 1 x daily - 7 x weekly - 2 sets - 10 reps - 2s hold - Supine Posterior Pelvic Tilt  - 1 x daily - 7 x weekly - 3 sets - 10 reps - 5s hold - Hooklying Single Knee to Chest Stretch  - 1 x daily - 7 x weekly - 2 sets - 6 reps - 10s hold  06/10/23: - Hooklying Hamstring Stretch with Strap  - 2 x daily - 7 x weekly - 1 sets - 3 reps - 30 hold  -06/22/23: Decompression Decompression with UE RTB   ASSESSMENT:  CLINICAL IMPRESSION: Pt presents with increased pain levels today following yard work a week ago.  Educated on deep breathing techniques to calm CNS for pain control.  Session on flexion based movements for pain control as well as posture strengthening.  Reviewed mechanics with decompression exercise, with min cueing to improve cervical retraction, pt reports she is trying this one  at home.  Did progress to decompression with theraband for postural strengthening that was tolerated well.  Pt continues to present with trunk flexed posture.  Educated importance seated posture and use of lumbar support.  Added seated postural strengthening exercises.  EOS pt reports pain reduced to 7/10.     Eval:  Patient is a 68 y.o. referred to PT for spinal stenosis who was seen today for physical therapy evaluation and treatment for low back pain increased over the past year with L leg weakness noted. Pt demonstrates decreased L LE strength, poor core / proximal control and lumbopelvic controlled mobility, forward flexion preference with spinal alignment, and increased pain with standing > walking > sitting. Pt multiple co morbidities including HTN and DM with multiple facotrs and systems evaluated and  stable/unchanging characteristics making Low complexity evaluation. Plan to see patient for skilled PT services secondary to functional testing indicating increasde risk for falls and decreased functional LE strength. Plan to see pt for 2x/wk for 10 weeks with good patient understanding.    OBJECTIVE IMPAIRMENTS: Abnormal gait, decreased activity tolerance, decreased balance, decreased knowledge of condition, decreased mobility, difficulty walking, decreased strength, hypomobility, and improper body mechanics.   ACTIVITY LIMITATIONS: carrying, standing, squatting, stairs, and bed mobility  PARTICIPATION LIMITATIONS: shopping, community activity, and yard work  PERSONAL FACTORS: 1-2 comorbidities: HTN and DM are also affecting patient's functional outcome.   REHAB POTENTIAL: Good  CLINICAL DECISION MAKING: Stable/uncomplicated  EVALUATION COMPLEXITY: Low   GOALS: Goals reviewed with patient? No  SHORT TERM GOALS: Target date: 08/10/23  Pt will be independent with HEP with compliance at least 60% of the time.  Baseline: prescribed Goal status: INITIAL  2.  Pt will report tolerance to  walking > 20 minutes with < 4/10 pain in low back in order to indicate improved lumbopelvic control and global stability offloading spine with activity.  Baseline: increased pain with gait > 10 minutes Goal status: INITIAL  LONG TERM GOALS: Target date: 08/10/23  Pt will score > 12 repetitions in 30s chair stand test to indicate improved functional BLE strength. Baseline: 9 repetitions Goal status: INITIAL  2.  Pt will report independence and participation in home making, ADL and community engagement with shopping without falling or reports of increased pain at the end of the day.  Baseline: increased pain with shopping therefore requires grocery cart  Goal status: INITIAL  3.  Pt will demonstrate proper floor to stand transfer as needed for recovery from fall with safety and independence.  Baseline: NT but reports need for increased time and use of support Goal status: INITIAL  4. Pt will be independent with HEP with compliance and progressions with participation in home gym program as appropriate. Baseline: prescribed Goal status: INITIAL  PLAN:  PT FREQUENCY: 2x/week  PT DURATION: 10 weeks  PLANNED INTERVENTIONS: 97110-Therapeutic exercises, 97530- Therapeutic activity, [id number]- Neuromuscular re-education, 97535- Self Care, [id number]- Manual therapy, [phone]- Gait training, Patient/Family education, Balance training, and Stair training.  PLAN FOR NEXT SESSION: body mechanics with squatting; core/lumbopelvic controlled mobility interventions.  Review decompression for UE with RTB- HEP.  Progress to seated then standing posture.  Hip flexor stretch with no back extension.  Minor Amble, LPTA/CLT; CBIS [phone]  11:03 AM, 06/22/23

## 2023-06-27 ENCOUNTER — Encounter (HOSPITAL_COMMUNITY): Payer: Self-pay

## 2023-06-27 ENCOUNTER — Ambulatory Visit (HOSPITAL_COMMUNITY)

## 2023-06-27 DIAGNOSIS — R262 Difficulty in walking, not elsewhere classified: Secondary | ICD-10-CM

## 2023-06-27 DIAGNOSIS — M545 Low back pain, unspecified: Secondary | ICD-10-CM

## 2023-06-27 NOTE — Therapy (Signed)
 OUTPATIENT PHYSICAL THERAPY THORACOLUMBAR TREATMENT   Patient Name: Jennifer Carey MRN: [id number] DOB:04-Feb-1955, 68 y.o., female Today's Date: 06/27/2023  END OF SESSION:  PT End of Session - 06/27/23 1056     Visit Number 6    Number of Visits 20    Date for PT Re-Evaluation 08/10/23    Authorization Type Blue Cross [facility] Medicare    Authorization Time Period no auth needed    PT Start Time 1018    PT Stop Time 1056    PT Time Calculation (min) 38 min    Activity Tolerance Patient tolerated treatment well;Patient limited by pain;No increased pain    Behavior During Therapy [facility] for tasks assessed/performed            Past Medical History:  Diagnosis Date   Diabetes mellitus without complication (HCC)    Hypertension    Past Surgical History:  Procedure Laterality Date   arm surgery Left    SHOULDER SURGERY     Patient Active Problem List   Diagnosis Date Noted   Essential hypertension 08/26/2016   Encounter for routine gynecological examination with Papanicolaou smear of cervix 08/26/2016    PCP: Minus Amel, MD  REFERRING PROVIDER:   Garry Kansas, MD   REFERRING DIAG: [phone] (ICD-10-CM) - Spinal stenosis, lumbar region with neurogenic claudication   Rationale for Evaluation and Treatment: Rehabilitation  THERAPY DIAG:  Midline low back pain, unspecified chronicity, unspecified whether sciatica present  Difficulty in walking, not elsewhere classified  ONSET DATE: 03/2022  SUBJECTIVE:                                                                                                                                                                                           SUBJECTIVE STATEMENT: Pt reports she had a very busy weekend, reports 7-8/10 today. She helped a friend move and walked a lot during a funeral. Pt reports her HEP is going, not doing all of them due to the large amoun tof HEP.  Eval:  Started in March over a year ago and it got  worse. Sometimes it's about a 7-8/10 and that at the end of the day and the first thing in the morning. Left leg more weak since March.   PERTINENT HISTORY: Attempted shots in May 2024; attempted medications for back pain; DM and HTN but under Ozempic    PAIN:  Are you having pain? Yes: NPRS scale: 7-8/10 Pain location: low back (middle lower back) - started with going down L LE (weakness noted) Pain description: aching and worse after working around a bit Aggravating factors: yard work, standing,  Relieving  factors: sitting,  Difficulty with sleeping - 5-6 hours max  PRECAUTIONS: None  RED FLAGS: None   WEIGHT BEARING RESTRICTIONS: No  FALLS:  Has patient fallen in last 6 months? 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  DIAGNOSTIC FINDINGS:  See chart  PATIENT SURVEYS:  Modified Oswestry 18/50 - 36%   COGNITION: Overall cognitive status: Within functional limits for tasks assessed     SENSATION: WFL  MUSCLE LENGTH: Hamstrings: Right tightness noted deg; Left tightness noted deg  POSTURE: increased lumbar lordosis / thoracic kyphosis  PALPATION: Hypertonicity lumbar paraspinals bilaterally  LUMBAR ROM:   AROM eval  Flexion 60%  Extension 10%  Right  lateral flexion 45%  Left lateral flexion 30% (pain increased)  Right rotation 40%  Left rotation 40%   (Blank rows = not tested)  LOWER EXTREMITY ROM:     Active  Right 06/10/23 Left 06/10/23  Hip flexion    Hip extension    Hip abduction    Hip adduction    Hip internal rotation    Hip external rotation    Knee flexion 112 118  Knee extension Lacking 9 degrees Lacking 4 degrees  Ankle dorsiflexion    Ankle plantarflexion    Ankle inversion    Ankle eversion     (Blank rows = not tested)  LOWER EXTREMITY MMT:    MMT Right eval Left eval  Hip flexion 4 4-  Hip extension 4- 4-  Hip abduction    Hip adduction    Hip internal rotation    Hip external rotation    Knee flexion 4 4-  Knee extension 4 4-  Ankle dorsiflexion 4 4  Ankle plantarflexion 4 4  Ankle inversion    Ankle eversion     (Blank rows = not tested)  LUMBAR SPECIAL TESTS:  FABER test: bilateral positive for tightness SLR passive - negative bilaterally   FUNCTIONAL TESTS:  5 times sit to stand: 16s 30s STS 9 repetitions without UE use; no increased pain  GAIT: - 06/10/23: 478ft no AD Distance walked:  Assistive device utilized:  Level of assistance:  Comments:   TREATMENT DATE:  06/27/2023  -Standing iliopsoas stretch with NM technique at gluteal for increased activation in standing 2 x 1; standing in // bars -Standing eccentric controlled stepdown from 6in step with BUE support on // bars 2 x 12 -Seated palloff press with GTB 2 x 15- cues for slow movement patterns -Seated shoulder extension with GTB and marching 3 x 30''  06/22/23: Supine:  Reviewed bed mobility  Deep breathing to calm CNS x , cueing to reduce accessory breathing  SKTC 2x 30  Posterior pelvic tilt paired with exhale x 1 min  Decompression 2-5 5x 5  Decompression UE movements RTB 5x  5 (may need to review next session) Seated:  Educated importance of seated posture for back control  Use of lumbar  support  Seated rows with RTB 10x 5  06/20/23 Supine: Decompression position with wedge and moist heat to low back x 5' and education on breathing technique for relaxation Decompression exercises 2-5; 5 hold x 8 reps each SKTC 20 hold x 3 each Active hamstring stretch 3 x 20 each LTR 5 hold x 10 each Abdominal bracing 5 hold x 10                                                                                                                                  PATIENT EDUCATION:  Education details: Plan of care, diagnosis/prognosis with POC Person educated: Patient Education method: Explanation, Demonstration, and Handouts Education comprehension: verbalized understanding and returned demonstration  HOME EXERCISE PROGRAM: Access Code: [email or web address] URL: [email or web address] Date: 06/01/2023 Prepared by: Leonard Raker  Exercises - Supine Lower Trunk Rotation  - 1 x daily - 7 x weekly - 2 sets - 10 reps - 2s hold - Supine Posterior Pelvic Tilt  - 1 x daily - 7 x weekly - 3 sets - 10 reps - 5s hold - Hooklying Single Knee to Chest Stretch  - 1 x daily - 7 x weekly - 2 sets - 6 reps - 10s hold  06/10/23: - Hooklying Hamstring Stretch with Strap  - 2 x daily - 7 x weekly - 1 sets - 3 reps - 30 hold  -06/22/23: Decompression Decompression with UE RTB   ASSESSMENT:  CLINICAL IMPRESSION:   Pt tolerated treatment session well. Included new interventions today but no updated HEP. Tolerated all new challenges well, pt enjoys utilizing therabands in interventions. Included for therapeutic activities in standing stretches to increase standing tolerance with proper posture, tolerated well with no increase in pain today. Adequate rest breaks given today due to fatigue from interventions. Pt will benefit from skilled Physical Therapy services to address deficits/limitations in order to improve functional and QOL.      Eval:  Patient is a 68 y.o. referred to PT for spinal  stenosis who was seen today for physical therapy evaluation and treatment for low back pain increased over the past year with L leg weakness noted. Pt demonstrates decreased L LE strength, poor core / proximal control and lumbopelvic controlled mobility, forward flexion preference with spinal alignment, and increased pain with standing > walking > sitting. Pt multiple co morbidities including HTN and DM with multiple facotrs and systems evaluated and stable/unchanging characteristics making Low complexity evaluation. Plan to see patient for skilled PT services secondary to functional testing indicating increasde risk for falls and decreased functional LE strength. Plan to see pt for 2x/wk for 10 weeks with good patient understanding.    OBJECTIVE IMPAIRMENTS: Abnormal gait, decreased activity tolerance, decreased balance, decreased knowledge of condition, decreased mobility, difficulty walking, decreased strength, hypomobility, and improper body  mechanics.   ACTIVITY LIMITATIONS: carrying, standing, squatting, stairs, and bed mobility  PARTICIPATION LIMITATIONS: shopping, community activity, and yard work  PERSONAL FACTORS: 1-2 comorbidities: HTN and DM are also affecting patient's functional outcome.   REHAB POTENTIAL: Good  CLINICAL DECISION MAKING: Stable/uncomplicated  EVALUATION COMPLEXITY: Low   GOALS: Goals reviewed with patient? No  SHORT TERM GOALS: Target date: 08/10/23  Pt will be independent with HEP with compliance at least 60% of the time.  Baseline: prescribed Goal status: INITIAL  2.  Pt will report tolerance to walking > 20 minutes with < 4/10 pain in low back in order to indicate improved lumbopelvic control and global stability offloading spine with activity.  Baseline: increased pain with gait > 10 minutes Goal status: INITIAL  LONG TERM GOALS: Target date: 08/10/23  Pt will score > 12 repetitions in 30s chair stand test to indicate improved functional BLE  strength. Baseline: 9 repetitions Goal status: INITIAL  2.  Pt will report independence and participation in home making, ADL and community engagement with shopping without falling or reports of increased pain at the end of the day.  Baseline: increased pain with shopping therefore requires grocery cart  Goal status: INITIAL  3.  Pt will demonstrate proper floor to stand transfer as needed for recovery from fall with safety and independence.  Baseline: NT but reports need for increased time and use of support Goal status: INITIAL  4. Pt will be independent with HEP with compliance and progressions with participation in home gym program as appropriate. Baseline: prescribed Goal status: INITIAL  PLAN:  PT FREQUENCY: 2x/week  PT DURATION: 10 weeks  PLANNED INTERVENTIONS: 97110-Therapeutic exercises, 97530- Therapeutic activity, [id number]- Neuromuscular re-education, 97535- Self Care, 16109- Manual therapy, [phone]- Gait training, Patient/Family education, Balance training, and Stair training.  PLAN FOR NEXT SESSION: body mechanics with squatting; core/lumbopelvic controlled mobility interventions.  Review decompression for UE with RTB- HEP.  Progress to seated then standing posture.  Hip flexor stretch with no back extension.  Minor Amble, LPTA/CLT; CBIS [phone]  10:57 AM, 06/27/23

## 2023-06-29 ENCOUNTER — Encounter (HOSPITAL_COMMUNITY)

## 2023-07-04 ENCOUNTER — Encounter (HOSPITAL_COMMUNITY): Payer: Self-pay

## 2023-07-04 ENCOUNTER — Ambulatory Visit (HOSPITAL_COMMUNITY)

## 2023-07-04 DIAGNOSIS — M545 Low back pain, unspecified: Secondary | ICD-10-CM

## 2023-07-04 DIAGNOSIS — R262 Difficulty in walking, not elsewhere classified: Secondary | ICD-10-CM

## 2023-07-04 NOTE — Therapy (Signed)
 OUTPATIENT PHYSICAL THERAPY THORACOLUMBAR TREATMENT   Patient Name: Jennifer Carey MRN: [id number] DOB:Apr 10, 1955, 68 y.o., female Today's Date: 07/04/2023  END OF SESSION:  PT End of Session - 07/04/23 1145     Visit Number 7    Number of Visits 20    Date for PT Re-Evaluation 08/10/23    Authorization Type Blue Cross [facility] Medicare    Authorization Time Period no auth needed    PT Start Time 1147    PT Stop Time 1229    PT Time Calculation (min) 42 min    Activity Tolerance Patient tolerated treatment well;Patient limited by pain;No increased pain    Behavior During Therapy [facility] for tasks assessed/performed            Past Medical History:  Diagnosis Date   Diabetes mellitus without complication (HCC)    Hypertension    Past Surgical History:  Procedure Laterality Date   arm surgery Left    SHOULDER SURGERY     Patient Active Problem List   Diagnosis Date Noted   Essential hypertension 08/26/2016   Encounter for routine gynecological examination with Papanicolaou smear of cervix 08/26/2016    PCP: Marvine Rush, MD  REFERRING PROVIDER:   Mavis Purchase, MD   REFERRING DIAG: [phone] (ICD-10-CM) - Spinal stenosis, lumbar region with neurogenic claudication   Rationale for Evaluation and Treatment: Rehabilitation  THERAPY DIAG:  Midline low back pain, unspecified chronicity, unspecified whether sciatica present  Difficulty in walking, not elsewhere classified  ONSET DATE: 03/2022  SUBJECTIVE:                                                                                                                                                                                           SUBJECTIVE STATEMENT: Pt reports she has still been doing her HEP at home. Reports she did some yard work this weekend. Reports 7-8/10 LBP. Reports her R knee has been hurting her since last visit. Will call MD to speak about possible surgery because she feels back is getting  worse intensity wise.    Eval:  Started in March over a year ago and it got worse. Sometimes it's about a 7-8/10 and that at the end of the day and the first thing in the morning. Left leg more weak since March.   PERTINENT HISTORY: Attempted shots in May 2024; attempted medications for back pain; DM and HTN but under Ozempic    PAIN:  Are you having pain? Yes: NPRS scale: 7-8/10 Pain location: low back (middle lower back) - started with going down L LE (weakness noted) Pain description: aching and worse after  working around a bit Aggravating factors: yard work, standing,  Relieving factors: sitting,  Difficulty with sleeping - 5-6 hours max  PRECAUTIONS: None  RED FLAGS: None   WEIGHT BEARING RESTRICTIONS: No  FALLS:  Has patient fallen in last 6 months? 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  DIAGNOSTIC FINDINGS:  See chart  PATIENT SURVEYS:  Modified Oswestry 18/50 - 36%   COGNITION: Overall cognitive status: Within functional limits for tasks assessed     SENSATION: WFL  MUSCLE LENGTH: Hamstrings: Right tightness noted deg; Left tightness noted deg  POSTURE: increased lumbar lordosis / thoracic kyphosis  PALPATION: Hypertonicity lumbar paraspinals  bilaterally  LUMBAR ROM:   AROM eval  Flexion 60%  Extension 10%  Right lateral flexion 45%  Left lateral flexion 30% (pain increased)  Right rotation 40%  Left rotation 40%   (Blank rows = not tested)  LOWER EXTREMITY ROM:     Active  Right 06/10/23 Left 06/10/23  Hip flexion    Hip extension    Hip abduction    Hip adduction    Hip internal rotation    Hip external rotation    Knee flexion 112 118  Knee extension Lacking 9 degrees Lacking 4 degrees  Ankle dorsiflexion    Ankle plantarflexion    Ankle inversion    Ankle eversion     (Blank rows = not tested)  LOWER EXTREMITY MMT:    MMT Right eval Left eval  Hip flexion 4 4-  Hip extension 4- 4-  Hip abduction    Hip adduction    Hip internal rotation    Hip external rotation    Knee flexion 4 4-  Knee extension 4 4-  Ankle dorsiflexion 4 4  Ankle plantarflexion 4 4  Ankle inversion    Ankle eversion     (Blank rows = not tested)  LUMBAR SPECIAL TESTS:  FABER test: bilateral positive for tightness SLR passive - negative bilaterally   FUNCTIONAL TESTS:  5 times sit to stand: 16s 30s STS 9 repetitions without UE use; no increased pain  GAIT: - 06/10/23: 420ft no AD Distance walked:  Assistive device utilized:  Level of assistance:  Comments:   TREATMENT DATE:  07/04/23: Recumbent bike, seat 14, 5' Supine:  PPT, 10 holds, 10x  DKTC, 10 holds,    Bridge, 5 holds, 10x2 Seated:   Scapular squeeze + TA contraction, 10  holds, 10x  Ts, YTB, 2x10, TA contract Standing:  Kettlebell squats: 10x w/ 5 lb, 8x w/ 10lb   06/27/2023  -Standing iliopsoas stretch with NM technique at gluteal for increased activation in standing 2 x 1; standing in // bars -Standing eccentric controlled stepdown from 6in step with BUE support on // bars 2 x 12 -Seated palloff press  with GTB 2 x 15- cues for slow movement patterns -Seated shoulder extension with GTB and marching 3 x  30''  06/22/23: Supine:  Reviewed bed mobility  Deep breathing to calm CNS x , cueing to reduce accessory breathing  SKTC 2x 30  Posterior pelvic tilt paired with exhale x 1 min  Decompression 2-5 5x 5  Decompression UE movements RTB 5x 5 (may need to review next session) Seated:  Educated importance of seated posture for back control  Use of lumbar support  Seated rows with RTB 10x 5                                                                                                                       PATIENT EDUCATION:  Education details: Plan of care, diagnosis/prognosis with POC Person educated: Patient Education method: Explanation, Demonstration, and Handouts Education comprehension: verbalized understanding and returned demonstration  HOME EXERCISE PROGRAM: Access Code: [email or web address] URL: [email or web address] Date: 06/01/2023 Prepared by: Lamarr Citrin  Exercises - Supine Lower Trunk Rotation  - 1 x daily - 7 x weekly - 2 sets - 10 reps - 2s hold - Supine Posterior Pelvic Tilt  - 1 x daily - 7 x weekly - 3 sets - 10 reps - 5s hold - Hooklying Single Knee to Chest Stretch  - 1 x daily - 7 x weekly - 2 sets - 6 reps - 10s hold  06/10/23: - Hooklying Hamstring Stretch with Strap  - 2 x daily - 7 x weekly - 1 sets - 3 reps - 30 hold  -06/22/23: Decompression Decompression with UE RTB   Access Code: [email or web address] URL: [email or web address] Date: 07/04/2023 Prepared by: Rosaria Powell-Butler  Exercises - Kettlebell Squat  - 2 x daily - 7 x weekly - 3 sets - 10 reps - Standing Shoulder Horizontal Abduction with Resistance  - 2 x daily - 7 x weekly - 3 sets - 10 reps   ASSESSMENT:  CLINICAL IMPRESSION: Patient arrives to PT session with reports of increased low back and R knee pain following last session. Reports she may be interested in surgery as her pain intensity has increased more recently. Reports she has seen benefit from PT with LE  strength but the pain intensity has been effecting sleep more than usual. Educated on contacting primary MD and updating front desk with decision. Began with focusing on low back mobility in supine. Bridges to neutral position tolerated well. Followed with postural strengthening/re-educ with emphasis on core engagement. Patient required verbal and visual cueing for proper lifting mechanics to protect spine, as she explains she still has trouble with lifting and carrying. Reports 7/10 pain at EOS. Pt will benefit from skilled Physical Therapy services to address deficits/limitations in order to improve functional and QOL.     Eval:  Patient is a 68 y.o. referred to PT for spinal stenosis who was seen today for physical therapy evaluation and treatment for low back pain increased over the past year with L leg weakness noted.  Pt demonstrates decreased L LE strength, poor core / proximal control and lumbopelvic controlled mobility, forward flexion preference with spinal alignment, and increased pain with standing > walking > sitting. Pt multiple co morbidities including HTN and DM with multiple facotrs and systems evaluated and stable/unchanging characteristics making Low complexity evaluation. Plan to see patient for skilled PT services secondary to functional testing indicating increasde risk for falls and decreased functional LE strength. Plan to see pt for 2x/wk for 10 weeks with good patient understanding.    OBJECTIVE IMPAIRMENTS: Abnormal gait, decreased activity tolerance, decreased balance, decreased knowledge of condition, decreased mobility, difficulty walking, decreased strength, hypomobility, and improper body mechanics.   ACTIVITY LIMITATIONS: carrying, standing, squatting, stairs, and bed mobility  PARTICIPATION LIMITATIONS: shopping, community activity, and yard work  PERSONAL FACTORS: 1-2 comorbidities: HTN and DM are also affecting patient's functional outcome.   REHAB POTENTIAL:  Good  CLINICAL DECISION MAKING: Stable/uncomplicated  EVALUATION COMPLEXITY: Low   GOALS: Goals reviewed with patient? No  SHORT TERM GOALS: Target date: 08/10/23  Pt will be independent with HEP with compliance at least 60% of the time.  Baseline: prescribed Goal status: INITIAL  2.  Pt will report tolerance to walking > 20 minutes with < 4/10 pain in low back in order to indicate improved lumbopelvic control and global stability offloading spine with activity.  Baseline: increased pain with gait > 10 minutes Goal status: INITIAL  LONG TERM GOALS: Target date: 08/10/23  Pt will score > 12 repetitions in 30s chair stand test to indicate improved functional BLE strength. Baseline: 9 repetitions Goal status: INITIAL  2.  Pt will report independence and participation in home making, ADL and community engagement with shopping without falling or reports of increased pain at the end of the day.  Baseline: increased pain with shopping therefore requires grocery cart  Goal status: INITIAL  3.  Pt will demonstrate proper floor to stand transfer as needed for recovery from fall with safety and independence.  Baseline: NT but reports need for increased time and use of support Goal status: INITIAL  4. Pt will be independent with HEP with compliance and progressions with participation in home gym program as appropriate. Baseline: prescribed Goal status: INITIAL  PLAN:  PT FREQUENCY: 2x/week  PT DURATION: 10 weeks  PLANNED INTERVENTIONS: 97110-Therapeutic exercises, 97530- Therapeutic activity, [id number]- Neuromuscular re-education, 97535- Self Care, [id number]- Manual therapy, [phone]- Gait training, Patient/Family education, Balance training, and Stair training.  PLAN FOR NEXT SESSION: body mechanics with squatting; core/lumbopelvic controlled mobility interventions.  Review decompression for UE with RTB- HEP.  Progress to seated then standing posture.  Hip flexor stretch with no back extension,  carrying mechanics, pt possibly to discontinue PT and opt in for surgery    12:37 PM, 07/04/23 Rosaria Settler, PT, DPT [facility]

## 2023-07-06 ENCOUNTER — Encounter (HOSPITAL_COMMUNITY): Payer: Self-pay

## 2023-07-06 ENCOUNTER — Ambulatory Visit (HOSPITAL_COMMUNITY)

## 2023-07-06 DIAGNOSIS — R262 Difficulty in walking, not elsewhere classified: Secondary | ICD-10-CM

## 2023-07-06 DIAGNOSIS — M545 Low back pain, unspecified: Secondary | ICD-10-CM | POA: Diagnosis not present

## 2023-07-06 NOTE — Therapy (Signed)
 OUTPATIENT PHYSICAL THERAPY THORACOLUMBAR TREATMENT   Patient Name: Jennifer Carey MRN: [id number] DOB:1956/01/05, 68 y.o., female Today's Date: 07/06/2023  END OF SESSION:  PT End of Session - 07/06/23 1110     Visit Number 8    Number of Visits 20    Date for PT Re-Evaluation 08/10/23    Authorization Type [employer] [facility] Medicare    Authorization Time Period no auth needed    PT Start Time 1110    PT Stop Time 1145    PT Time Calculation (min) 35 min    Activity Tolerance Patient tolerated treatment well;Patient limited by pain;No increased pain    Behavior During Therapy [facility] for tasks assessed/performed            Past Medical History:  Diagnosis Date   Diabetes mellitus without complication (HCC)    Hypertension    Past Surgical History:  Procedure Laterality Date   arm surgery Left    SHOULDER SURGERY     Patient Active Problem List   Diagnosis Date Noted   Essential hypertension 08/26/2016   Encounter for routine gynecological examination with Papanicolaou smear of cervix 08/26/2016    PCP: Marvine Rush, MD  REFERRING PROVIDER:   Mavis Purchase, MD   REFERRING DIAG: [phone] (ICD-10-CM) - Spinal stenosis, lumbar region with neurogenic claudication   Rationale for Evaluation and Treatment: Rehabilitation  THERAPY DIAG:  Midline low back pain, unspecified chronicity, unspecified whether sciatica present  Difficulty in walking, not elsewhere classified  ONSET DATE: 03/2022  SUBJECTIVE:                                                                                                                                                                                           SUBJECTIVE STATEMENT: Pt arrives late to session. Pt reports legs were sore after last session. Reports low back pain is around 7-8. Reports she will wait one more week.   Eval:  Started in March over a year ago and it got worse. Sometimes it's about a 7-8/10 and that at the  end of the day and the first thing in the morning. Left leg more weak since March.   PERTINENT HISTORY: Attempted shots in May 2024; attempted medications for back pain; DM and HTN but under Ozempic    PAIN:  Are you having pain? Yes: NPRS scale: 7-8/10 Pain location: low back (middle lower back) - started with going down L LE (weakness noted) Pain description: aching and worse after working around a bit Aggravating factors: yard work, standing,  Relieving factors: sitting,  Difficulty with sleeping - 5-6 hours max  PRECAUTIONS: None  RED FLAGS: None   WEIGHT BEARING RESTRICTIONS: No  FALLS:  Has patient fallen in last 6 months? 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  DIAGNOSTIC FINDINGS:  See chart  PATIENT SURVEYS:  Modified Oswestry 18/50 - 36%   COGNITION: Overall cognitive status: Within functional limits for tasks assessed     SENSATION: WFL  MUSCLE LENGTH: Hamstrings: Right tightness noted deg; Left tightness noted deg  POSTURE: increased lumbar lordosis / thoracic kyphosis  PALPATION: Hypertonicity lumbar paraspinals bilaterally  LUMBAR ROM:   AROM eval  Flexion 60%  Extension 10%  Right lateral flexion 45%  Left lateral flexion 30% (pain  increased)  Right rotation 40%  Left rotation 40%   (Blank rows = not tested)  LOWER EXTREMITY ROM:     Active  Right 06/10/23 Left 06/10/23  Hip flexion    Hip extension    Hip abduction    Hip adduction    Hip internal rotation    Hip external rotation    Knee flexion 112 118  Knee extension Lacking 9 degrees Lacking 4 degrees  Ankle dorsiflexion    Ankle plantarflexion    Ankle inversion    Ankle eversion     (Blank rows = not tested)  LOWER EXTREMITY MMT:    MMT Right eval Left eval  Hip flexion 4 4-  Hip extension 4- 4-  Hip abduction    Hip adduction    Hip internal rotation    Hip external rotation    Knee flexion 4 4-  Knee extension 4 4-  Ankle dorsiflexion 4 4  Ankle plantarflexion 4 4  Ankle inversion    Ankle eversion     (Blank rows = not tested)  LUMBAR SPECIAL TESTS:  FABER test: bilateral positive for tightness SLR passive - negative bilaterally   FUNCTIONAL TESTS:  5 times sit to stand: 16s 30s STS 9 repetitions without UE use; no increased pain  GAIT: - 06/10/23: 425ft no AD Distance walked:  Assistive device utilized:  Level of assistance:  Comments:   TREATMENT DATE:  07/06/23: Review of lifting mechanics, 10 lb from ground level, 5x2, verbal cues for upright posture and TA contraction  Carrying mechanics: 10lb, 3x, verbal cues for TA contraction, shoulder depression, upright posture, and PPT  144 ft -->108 ft -->144 ft Seated PPT, 5 holds, 12x PPT + Standing Heel taps, 8 inch step, in // bars, 10x  10x, 3 lb AW, Standing hip abd + TA contraction, 3 lb AW, 10x2  07/04/23: Recumbent bike, seat 14, 5' Supine:  PPT, 10 holds, 10x  DKTC, 10 holds,    Bridge, 5 holds, 10x2 Seated:   Scapular squeeze + TA contraction, 10  holds, 10x  Ts, YTB, 2x10, TA contract Standing:  Kettlebell squats:  10x w/ 5 lb, 8x w/ 10lb   06/27/2023  -Standing iliopsoas stretch with NM technique at gluteal for increased activation in standing  2 x 1; standing in // bars -Standing eccentric controlled stepdown from 6in step with BUE support on // bars 2 x 12 -Seated palloff press with GTB 2 x 15- cues for slow movement patterns -Seated shoulder extension with GTB and marching 3 x 30''                                                           PATIENT EDUCATION:  Education details: Plan of care, diagnosis/prognosis with POC Person educated: Patient Education method: Explanation, Demonstration, and Handouts Education comprehension: verbalized understanding and returned demonstration  HOME EXERCISE PROGRAM: Access Code: [email or web address] URL: [email or web address] Date: 06/01/2023 Prepared by: Lamarr Citrin  Exercises - Supine Lower Trunk Rotation  - 1 x daily - 7 x weekly - 2 sets - 10 reps - 2s hold - Supine Posterior Pelvic Tilt  - 1 x daily - 7 x weekly - 3 sets - 10 reps - 5s hold - Hooklying Single Knee to Chest Stretch  - 1 x daily - 7 x weekly - 2 sets - 6 reps - 10s hold  06/10/23: - Hooklying Hamstring Stretch with Strap  - 2 x daily - 7 x weekly - 1 sets - 3 reps - 30 hold  -06/22/23: Decompression Decompression with UE RTB   Access Code: [email or web address] URL: [email or web address] Date: 07/04/2023 Prepared by: Rosaria Powell-Butler  Exercises - Kettlebell Squat  - 2 x daily - 7 x weekly - 3 sets - 10 reps - Standing Shoulder Horizontal Abduction with Resistance  - 2 x daily - 7 x weekly - 3 sets - 10 reps   ASSESSMENT:  CLINICAL IMPRESSION: Patient tolerated session well. Began with review of lifting mechanics. Patient lifting 10 lb weights in box, demo good carryover of form from last session. Followed with carrying mechanics, ambulating around clinic carrying 10 lb weight. Pt initially demo forward lean, shoulder hiking, carrying weight low, and reporting inc discomfort in low back. Verbal and tactile cueing given to address the above, emphasis on keeping weight around chest height, TA  contraction and PPT while ambulating to decrease pain. Patient reporting dec discomfort with third trial. Remainder of session focused on NMR with PPT with functional activities. Pt will benefit from skilled Physical Therapy services to address deficits/limitations in order to improve functional and QOL.     Eval:  Patient is a 68 y.o. referred to PT for spinal stenosis who was seen today for physical therapy evaluation and treatment for low back pain increased over the past year with L leg weakness noted. Pt demonstrates decreased L LE strength, poor core / proximal control and lumbopelvic controlled mobility, forward flexion preference with spinal alignment, and increased pain with standing > walking > sitting. Pt multiple co morbidities including HTN and DM with multiple facotrs and systems evaluated and stable/unchanging characteristics making Low complexity evaluation. Plan to see patient for skilled PT services secondary to functional testing indicating increasde risk for falls and decreased functional LE strength. Plan to see pt for 2x/wk for 10 weeks with good patient understanding.    OBJECTIVE IMPAIRMENTS: Abnormal gait, decreased activity tolerance, decreased balance, decreased knowledge of condition, decreased mobility, difficulty walking,  decreased strength, hypomobility, and improper body mechanics.   ACTIVITY LIMITATIONS: carrying, standing, squatting, stairs, and bed mobility  PARTICIPATION LIMITATIONS: shopping, community activity, and yard work  PERSONAL FACTORS: 1-2 comorbidities: HTN and DM are also affecting patient's functional outcome.   REHAB POTENTIAL: Good  CLINICAL DECISION MAKING: Stable/uncomplicated  EVALUATION COMPLEXITY: Low   GOALS: Goals reviewed with patient? No  SHORT TERM GOALS: Target date: 08/10/23  Pt will be independent with HEP with compliance at least 60% of the time.  Baseline: prescribed Goal status: INITIAL  2.  Pt will report tolerance to  walking > 20 minutes with < 4/10 pain in low back in order to indicate improved lumbopelvic control and global stability offloading spine with activity.  Baseline: increased pain with gait > 10 minutes Goal status: INITIAL  LONG TERM GOALS: Target date: 08/10/23  Pt will score > 12 repetitions in 30s chair stand test to indicate improved functional BLE strength. Baseline: 9 repetitions Goal status: INITIAL  2.  Pt will report independence and participation in home making, ADL and community engagement with shopping without falling or reports of increased pain at the end of the day.  Baseline: increased pain with shopping therefore requires grocery cart  Goal status: INITIAL  3.  Pt will demonstrate proper floor to stand transfer as needed for recovery from fall with safety and independence.  Baseline: NT but reports need for increased time and use of support Goal status: INITIAL  4. Pt will be independent with HEP with compliance and progressions with participation in home gym program as appropriate. Baseline: prescribed Goal status: INITIAL  PLAN:  PT FREQUENCY: 2x/week  PT DURATION: 10 weeks  PLANNED INTERVENTIONS: 97110-Therapeutic exercises, 97530- Therapeutic activity, [id number]- Neuromuscular re-education, 97535- Self Care, [id number]- Manual therapy, [phone]- Gait training, Patient/Family education, Balance training, and Stair training.  PLAN FOR NEXT SESSION: body mechanics with squatting; core/lumbopelvic controlled mobility interventions.  Review decompression for UE with RTB- HEP.  Progress to seated then standing posture.  Hip flexor stretch with no back extension, carrying mechanics, pt wanting to continue PT a few weeks before contacting MD for possible surgery   11:48 AM, 07/06/23 Rosaria Settler, PT, DPT [facility] [place]

## 2023-07-11 ENCOUNTER — Encounter (HOSPITAL_COMMUNITY): Admitting: Physical Therapy

## 2023-07-13 ENCOUNTER — Encounter (HOSPITAL_COMMUNITY)

## 2023-07-18 ENCOUNTER — Encounter (HOSPITAL_COMMUNITY)

## 2023-07-25 ENCOUNTER — Encounter (HOSPITAL_COMMUNITY)

## 2023-07-27 ENCOUNTER — Encounter (HOSPITAL_COMMUNITY): Admitting: Physical Therapy

## 2023-08-01 ENCOUNTER — Encounter (HOSPITAL_COMMUNITY)

## 2023-08-03 ENCOUNTER — Encounter (HOSPITAL_COMMUNITY)

## 2023-08-08 ENCOUNTER — Encounter (HOSPITAL_COMMUNITY)

## 2023-08-23 ENCOUNTER — Other Ambulatory Visit: Payer: Self-pay | Admitting: Neurosurgery

## 2023-08-25 ENCOUNTER — Other Ambulatory Visit (HOSPITAL_COMMUNITY): Payer: Self-pay | Admitting: Neurosurgery

## 2023-08-25 DIAGNOSIS — M4316 Spondylolisthesis, lumbar region: Secondary | ICD-10-CM

## 2023-08-29 ENCOUNTER — Ambulatory Visit (HOSPITAL_COMMUNITY)
Admission: RE | Admit: 2023-08-29 | Discharge: 2023-08-29 | Disposition: A | Discharge status: Home / Self Care | Source: Ambulatory Visit | Attending: Neurosurgery | Admitting: Neurosurgery

## 2023-08-29 DIAGNOSIS — M4316 Spondylolisthesis, lumbar region: Secondary | ICD-10-CM | POA: Diagnosis present

## 2023-09-02 NOTE — Progress Notes (Signed)
 Surgical Instructions   Your procedure is scheduled on Thursday September 08, 2023. Report to [facility] Main Entrance A at 7:55 A.M., then check in with the Admitting office. Any questions or running late day of surgery: call [phone]  Questions prior to your surgery date: call [phone], Monday-Friday, 8am-4pm. If you experience any cold or flu symptoms such as cough, fever, chills, shortness of breath, etc. between now and your scheduled surgery, please notify us  at the above number.     Remember:  Do not eat or drink after midnight the night before your surgery  Take these medicines the morning of surgery with A SIP OF WATER  amLODipine  (NORVASC )  carboxymethylcellulose (REFRESH PLUS) 0.5 % SOLN  metoprolol tartrate (LOPRESSOR)   Follow your surgeon's instructions on when to stop Asprin.  If no instructions were given by your surgeon then you will need to call the office to get those instructions.      One week prior to surgery, STOP taking any Aleve, Naproxen, Ibuprofen, Motrin, Advil, Goody's, BC's, all herbal medications, fish oil, and non-prescription vitamins.   WHAT DO I DO ABOUT MY DIABETES MEDICATION?   Do not take oral diabetes medicines (pills) the morning of surgery.        DO NOT TAKE YOUR sitaGLIPtin-metformin (JANUMET) THE MORNING OF SURGERY.          PER YOUR SURGEON'S INSTRUCTIONS, PLEASE HOLD YOUR  OZEMPIC 2 WEEKS PRIOR TO SURGERY WITH THE LAST DOSE BEING NO LATER THAN 08/24/2023.  HOW TO MANAGE YOUR DIABETES BEFORE AND AFTER SURGERY  Why is it important to control my blood sugar before and after surgery? Improving blood sugar levels before and after surgery helps healing and can limit problems. A way of improving blood sugar control is eating a healthy diet by:  Eating less sugar and carbohydrates  Increasing activity/exercise  Talking with your doctor about reaching your blood sugar goals High blood sugars (greater than 180 mg/dL) can raise your  risk of infections and slow your recovery, so you will need to focus on controlling your diabetes during the weeks before surgery. Make sure that the doctor who takes care of your diabetes knows about your planned surgery including the date and location.  How do I manage my blood sugar before surgery? Check your blood sugar at least 4 times a day, starting 2 days before surgery, to make sure that the level is not too high or low.  Check your blood sugar the morning of your surgery when you wake up and every 2 hours until you get to the Short Stay unit.  If your blood sugar is less than 70 mg/dL, you will need to treat for low blood sugar: Do not take insulin. Treat a low blood sugar (less than 70 mg/dL) with  cup of clear juice (cranberry or apple), 4 glucose tablets, OR glucose gel. Recheck blood sugar in 15 minutes after treatment (to make sure it is greater than 70 mg/dL). If your blood sugar is not greater than 70 mg/dL on recheck, call [phone] for further instructions. Report your blood sugar to the short stay nurse when you get to Short Stay.  If you are admitted to the hospital after surgery: Your blood sugar will be checked by the staff and you will probably be given insulin after surgery (instead of oral diabetes medicines) to make sure you have good blood sugar levels. The goal for blood sugar control after surgery is 80-180 mg/dL.  Do NOT Smoke (Tobacco/Vaping) for 24 hours prior to your procedure.  If you use a CPAP at night, you may bring your mask/headgear for your overnight stay.   You will be asked to remove any contacts, glasses, piercing's, hearing aid's, dentures/partials prior to surgery. Please bring cases for these items if needed.    Patients discharged the day of surgery will not be allowed to drive home, and someone needs to stay with them for 24 hours.  SURGICAL WAITING ROOM VISITATION Patients may have no more than 2 support people in  the waiting area - these visitors may rotate.   Pre-op nurse will coordinate an appropriate time for 1 ADULT support person, who may not rotate, to accompany patient in pre-op.  Children under the age of 61 must have an adult with them who is not the patient and must remain in the main waiting area with an adult.  If the patient needs to stay at the hospital during part of their recovery, the visitor guidelines for inpatient rooms apply.  Please refer to the [facility] website for the visitor guidelines for any additional information.   If you received a COVID test during your pre-op visit  it is requested that you wear a mask when out in public, stay away from anyone that may not be feeling well and notify your surgeon if you develop symptoms. If you have been in contact with anyone that has tested positive in the last 10 days please notify you surgeon.      Pre-operative 5 CHG Bathing Instructions   You can play a key role in reducing the risk of infection after surgery. Your skin needs to be as free of germs as possible. You can reduce the number of germs on your skin by washing with CHG (chlorhexidine gluconate) soap before surgery. CHG is an antiseptic soap that kills germs and continues to kill germs even after washing.   DO NOT use if you have an allergy to chlorhexidine/CHG or antibacterial soaps. If your skin becomes reddened or irritated, stop using the CHG and notify one of our RNs at [phone].   Please shower with the CHG soap starting 4 days before surgery using the following schedule:     Please keep in mind the following:  DO NOT shave, including legs and underarms, starting the day of your first shower.   Place clean sheets on your bed the day you start using CHG soap. Use a clean washcloth (not used since being washed) for each shower. DO NOT sleep with pets once you start using the CHG.   CHG Shower Instructions:  Wash your face and private area with normal soap.  If you choose to wash your hair, wash first with your normal shampoo.  After you use shampoo/soap, rinse your hair and body thoroughly to remove shampoo/soap residue.  Turn the water OFF and apply about 3 tablespoons (45 ml) of CHG soap to a CLEAN washcloth.  Apply CHG soap ONLY FROM YOUR NECK DOWN TO YOUR TOES (washing for 3-5 minutes)  DO NOT use CHG soap on face, private areas, open wounds, or sores.  Pay special attention to the area where your surgery is being performed.  If you are having back surgery, having someone wash your back for you may be helpful. Wait 2 minutes after CHG soap is applied, then you may rinse off the CHG soap.  Pat dry with a clean towel  Put on clean clothes/pajamas   If you choose  to wear lotion, please use ONLY the CHG-compatible lotions that are listed below.  Additional instructions for the day of surgery: DO NOT APPLY any lotions, deodorants or perfumes.   Do not bring valuables to the hospital. [facility] is not responsible for any belongings/valuables. Do not wear nail polish, gel polish, artificial nails, or any other type of covering on natural nails (fingers and toes) Do not wear jewelry or makeup Put on clean/comfortable clothes.  Please brush your teeth.  Ask your nurse before applying any prescription medications to the skin.     CHG Compatible Lotions   Aveeno Moisturizing lotion  Cetaphil Moisturizing Cream  Cetaphil Moisturizing Lotion  Clairol Herbal Essence Moisturizing Lotion, Dry Skin  Clairol Herbal Essence Moisturizing Lotion, Extra Dry Skin  Clairol Herbal Essence Moisturizing Lotion, Normal Skin  Curel Age Defying Therapeutic Moisturizing Lotion with Alpha Hydroxy  Curel Extreme Care Body Lotion  Curel Soothing Hands Moisturizing Hand Lotion  Curel Therapeutic Moisturizing Cream, Fragrance-Free  Curel Therapeutic Moisturizing Lotion, Fragrance-Free  Curel Therapeutic Moisturizing Lotion, Original Formula  Eucerin Daily  Replenishing Lotion  Eucerin Dry Skin Therapy Plus Alpha Hydroxy Crme  Eucerin Dry Skin Therapy Plus Alpha Hydroxy Lotion  Eucerin Original Crme  Eucerin Original Lotion  Eucerin Plus Crme Eucerin Plus Lotion  Eucerin TriLipid Replenishing Lotion  Keri Anti-Bacterial Hand Lotion  Keri Deep Conditioning Original Lotion Dry Skin Formula Softly Scented  Keri Deep Conditioning Original Lotion, Fragrance Free Sensitive Skin Formula  Keri Lotion Fast Absorbing Fragrance Free Sensitive Skin Formula  Keri Lotion Fast Absorbing Softly Scented Dry Skin Formula  Keri Original Lotion  Keri Skin Renewal Lotion Keri Silky Smooth Lotion  Keri Silky Smooth Sensitive Skin Lotion  Nivea Body Creamy Conditioning Oil  Nivea Body Extra Enriched Lotion  Nivea Body Original Lotion  Nivea Body Sheer Moisturizing Lotion Nivea Crme  Nivea Skin Firming Lotion  NutraDerm 30 Skin Lotion  NutraDerm Skin Lotion  NutraDerm Therapeutic Skin Cream  NutraDerm Therapeutic Skin Lotion  ProShield Protective Hand Cream  Provon moisturizing lotion  Please read over the following fact sheets that you were given.

## 2023-09-05 ENCOUNTER — Encounter (HOSPITAL_COMMUNITY)
Admission: RE | Admit: 2023-09-05 | Discharge: 2023-09-05 | Disposition: A | Discharge status: Home / Self Care | Source: Ambulatory Visit | Attending: Neurosurgery | Admitting: Neurosurgery

## 2023-09-05 ENCOUNTER — Encounter (HOSPITAL_COMMUNITY): Payer: Self-pay

## 2023-09-05 VITALS — BP 146/99 | HR 66 | Temp 98.6°F | Resp 17 | Ht 68.0 in | Wt 226.0 lb

## 2023-09-05 DIAGNOSIS — E119 Type 2 diabetes mellitus without complications: Secondary | ICD-10-CM | POA: Insufficient documentation

## 2023-09-05 DIAGNOSIS — M4316 Spondylolisthesis, lumbar region: Secondary | ICD-10-CM | POA: Insufficient documentation

## 2023-09-05 DIAGNOSIS — Z01818 Encounter for other preprocedural examination: Secondary | ICD-10-CM | POA: Diagnosis present

## 2023-09-05 DIAGNOSIS — I10 Essential (primary) hypertension: Secondary | ICD-10-CM | POA: Diagnosis not present

## 2023-09-05 LAB — CBC
HCT: 38.1 % (ref 36.0–46.0)
Hemoglobin: 13.2 g/dL (ref 12.0–15.0)
MCH: 28.7 pg (ref 26.0–34.0)
MCHC: 34.6 g/dL (ref 30.0–36.0)
MCV: 82.8 fL (ref 80.0–100.0)
Platelets: 299 K/uL (ref 150–400)
RBC: 4.6 MIL/uL (ref 3.87–5.11)
RDW: 12.4 % (ref 11.5–15.5)
WBC: 7.1 K/uL (ref 4.0–10.5)
nRBC: 0 % (ref 0.0–0.2)

## 2023-09-05 LAB — SURGICAL PCR SCREEN
MRSA, PCR: NEGATIVE
Staphylococcus aureus: POSITIVE — AB

## 2023-09-05 LAB — BASIC METABOLIC PANEL WITH GFR
Anion gap: 10 (ref 5–15)
BUN: 19 mg/dL (ref 8–23)
CO2: 27 mmol/L (ref 22–32)
Calcium: 9.4 mg/dL (ref 8.9–10.3)
Chloride: 93 mmol/L — ABNORMAL LOW (ref 98–111)
Creatinine, Ser: 1.01 mg/dL — ABNORMAL HIGH (ref 0.44–1.00)
GFR, Estimated: 60 mL/min — ABNORMAL LOW (ref >60)
Glucose, Bld: 119 mg/dL — ABNORMAL HIGH (ref 70–99)
Potassium: 3.9 mmol/L (ref 3.5–5.1)
Sodium: 130 mmol/L — ABNORMAL LOW (ref 135–145)

## 2023-09-05 LAB — GLUCOSE, CAPILLARY: Glucose-Capillary: 125 mg/dL — ABNORMAL HIGH (ref 70–99)

## 2023-09-05 LAB — HEMOGLOBIN A1C
Hgb A1c MFr Bld: 5.7 % — ABNORMAL HIGH (ref 4.8–5.6)
Mean Plasma Glucose: 116.89 mg/dL

## 2023-09-05 NOTE — Progress Notes (Signed)
 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   I evaluated patient during her PAT visit due to reported runny nose (unusual for her) and headache for about 2 1/2 days starting on 08/30/2023. She denied sore throat, fever, cough, SOB, no known sick contacts. She took cold medicine on 08/31/2023, but otherwise felt mostly better by 09/01/2023. She sounded a little nasally at PAT and said her head still feels swimmy.   She appeared well at PAT visit. As above, mild nasal quality to voice, but otherwise no coughing, hoarseness, congestion, runny nose, or conversational dyspnea noted. She was sitting in wheelchair due to back issues. She denied weakness in legs, or bowel/bladder dysfunction. She said activity is more limited over the past year due to her back issues, but prior to that was doing yard work (not mowing), house cleaning (including vacuuming), shopping. She still does some of those same activities, but may have to rest more or will have worsening back pain afterwards.   Very mild URI symptoms, overall improved as discussed above. Symptoms started within 10 days of planned surgery. Discussed with anesthesiologist Keneth Duncans, MD. Patient to take a home COVID-19 test and let me know the results.  UPDATE:   Patient called to report her home COVID test was positive. Since symptoms > 5 days out and with minimal residual symptoms, I advised continued supportive care. If she prefers or if she develops worsening symptoms then advised to follow-up with PCP.  In regards to surgery, discussed surgery would likely need to be postponed if considered elective but would review with anesthesiologist and surgeon.    Discussed with anesthesiologist Jerrye Sharper, MD who agreed that although with pretty mild symptoms, she will still be within 10 days of initial symptoms from her surgery date, so if elective would advise postponing surgery for ~ 2-4 weeks. Message left for [facility] to discuss with Dr. Mavis' for further input.   A1c 5.7%. She is on Janumet and Ozempic. Last Ozempic 08/22/2023.  ASA on hold for since 08/22/2023.   VS: BP (!) 146/99   Pulse 66   Temp 37 C   Resp 17   Ht 5' 8 (1.727 m)   Wt 102.5 kg   SpO2 97%   BMI 34.36 kg/m  Provider wore face mask during visit.  Heart RRR, no murmur noted. Lung clears. No carotid bruits noted. No ankle edema noted.    PROVIDERS: Marvine Rush, MD is PCP    LABS: Preoperative labs noted. See DISCUSSION. (all labs ordered are listed, but only abnormal results are displayed)  Labs Reviewed  SURGICAL PCR SCREEN - Abnormal; Notable for the following components:      Result Value   Staphylococcus aureus POSITIVE (*)    All other components within normal limits  GLUCOSE, CAPILLARY - Abnormal; Notable for the following components:   Glucose-Capillary 125 (*)  All other components within normal limits  HEMOGLOBIN A1C - Abnormal; Notable for the following components:   Hgb A1c MFr Bld 5.7 (*)    All other components within normal limits  BASIC METABOLIC PANEL WITH GFR - Abnormal; Notable for the following components:   Sodium 130 (*)    Chloride 93 (*)    Glucose, Bld 119 (*)    Creatinine, Ser 1.01 (*)    GFR, Estimated 60 (*)    All other  components within normal limits  CBC  TYPE AND SCREEN     IMAGES: MRI L-spine 08/29/2023: IMPRESSION: 1. 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 2. Sigmoid colon diverticulosis.      EKG: 09/05/2023: Sinus rhythm with 1st degree A-V block Left axis deviation Minimal voltage criteria for LVH, may be normal variant ( Cornell product ) Abnormal ECG - Overall, EKG appears stable when compared to 06/11/2021 tracing. PR interval 201->211ms.    CV: N/A   Past Medical History:  Diagnosis Date   Diabetes mellitus without complication (HCC)    Hypertension     Past Surgical History:  Procedure Laterality Date   arm surgery Left    SHOULDER SURGERY      MEDICATIONS:  amLODipine  (NORVASC ) 5 MG tablet   aspirin EC 81 MG tablet   carboxymethylcellulose (REFRESH PLUS) 0.5 % SOLN   metoprolol tartrate (LOPRESSOR) 50 MG tablet   olmesartan-hydrochlorothiazide (BENICAR HCT) 40-25 MG tablet   OZEMPIC, 2 MG/DOSE, 8 MG/3ML SOPN   potassium chloride  SA (KLOR-CON  M) 20 MEQ tablet   sitaGLIPtin-metformin (JANUMET) 50-1000 MG tablet   No current facility-administered medications for this encounter.     Isaiah Ruder, PA-C Surgical Short Stay/Anesthesiology [facility] Phone [phone] [facility] Phone [phone] 09/05/2023 7:22 PM

## 2023-09-05 NOTE — Progress Notes (Addendum)
 PCP - Norleen Marvine COME Cardiologist - denies  PPM/ICD - denies Device Orders -  Rep Notified -   Chest x-ray - na EKG - 09/05/23 Stress Test - denies ECHO - denies Cardiac Cath - denies  Sleep Study - denies CPAP - no  Fasting Blood Sugar - 120 Checks Blood Sugar 3 times a week  Last dose of GLP1 agonist-  08/22/2023 GLP1 instructions: hold Ozempic 7 days prior to surgery  Blood Thinner Instructions:na Aspirin Instructions:pt states she has held aspirin since 08/22/23 per surgeon's instructions.   ERAS Protcol -no PRE-SURGERY Ensure or G2-   COVID TEST- na   Anesthesia review: pt had runny nose and headache last week. Reports last symptoms were 08/31/23. Notified Allison Zelenak,PA-C who came to see patient in PAT.   Patient denies shortness of breath, fever, cough and chest pain at PAT appointment   All instructions explained to the patient, with a verbal understanding of the material. Patient agrees to go over the instructions while at home for a better understanding. The opportunity to ask questions was provided.

## 2023-09-06 LAB — TYPE AND SCREEN
ABO/RH(D): A POS
Antibody Screen: NEGATIVE

## 2023-11-08 NOTE — Progress Notes (Signed)
 Surgical Instructions   Your procedure is scheduled on Monday November 14, 2023. Report to [facility] Main Entrance A at 5:30 A.M., then check in with the Admitting office. Any questions or running late day of surgery: call [phone]  Questions prior to your surgery date: call [phone], Monday-Friday, 8am-4pm. If you experience any cold or flu symptoms such as cough, fever, chills, shortness of breath, etc. between now and your scheduled surgery, please notify us  at the above number.     Remember:  Do not eat or drink after midnight the night before your surgery  Take these medicines the morning of surgery with A SIP OF WATER  amLODipine  (NORVASC )  carboxymethylcellulose (REFRESH PLUS) 0.5 % SOLN  metoprolol tartrate (LOPRESSOR)   May take these medicines IF NEEDED: None  Follow your surgeon's instructions on when to stop Asprin.  If no instructions were given by your surgeon then you will need to call the office to get those instructions.     One week prior to surgery, STOP taking any Aleve, Naproxen, Ibuprofen, Motrin, Advil, Goody's, BC's, all herbal medications, fish oil, and non-prescription vitamins.  WHAT DO I DO ABOUT MY DIABETES MEDICATION?   Do not take oral diabetes medicines (pills) the morning of surgery.        PER YOUR SURGEON'S INSTRUCTIONS, DO NOT TAKE YOUR OZEMPIC 2 WEEKS PRIOR TO SURGERY, WITH THE LAST DOSE BEING NO LATER THAN 10/30/2023.          DO NOT TAKE YOUR sitaGLIPtin-metformin (JANUMET) THE MORNING OF SURGERY.     The day of surgery, do not take other diabetes injectables, including Byetta (exenatide), Bydureon (exenatide ER), Victoza (liraglutide), or Trulicity (dulaglutide).  If your CBG is greater than 220 mg/dL, you may take  of your sliding scale (correction) dose of insulin.   HOW TO MANAGE YOUR DIABETES BEFORE AND AFTER SURGERY  Why is it important to control my blood sugar before and after surgery? Improving blood sugar levels  before and after surgery helps healing and can limit problems. A way of improving blood sugar control is eating a healthy diet by:  Eating less sugar and carbohydrates  Increasing activity/exercise  Talking with your doctor about reaching your blood sugar goals High blood sugars (greater than 180 mg/dL) can raise your risk of infections and slow your recovery, so you will need to focus on controlling your diabetes during the weeks before surgery. Make sure that the doctor who takes care of your diabetes knows about your planned surgery including the date and location.  How do I manage my blood sugar before surgery? Check your blood sugar at least 4 times a day, starting 2 days before surgery, to make sure that the level is not too high or low.  Check your blood sugar the morning of your surgery when you wake up and every 2 hours until you get to the Short Stay unit.  If your blood sugar is less than 70 mg/dL, you will need to treat for low blood sugar: Do not take insulin. Treat a low blood sugar (less than 70 mg/dL) with  cup of clear juice (cranberry or apple), 4 glucose tablets, OR glucose gel. Recheck blood sugar in 15 minutes after treatment (to make sure it is greater than 70 mg/dL). If your blood sugar is not greater than 70 mg/dL on recheck, call [phone] for further instructions. Report your blood sugar to the short stay nurse when you get to Short Stay.  If you are admitted  to the hospital after surgery: Your blood sugar will be checked by the staff and you will probably be given insulin after surgery (instead of oral diabetes medicines) to make sure you have good blood sugar levels. The goal for blood sugar control after surgery is 80-180 mg/dL.                      Do NOT Smoke (Tobacco/Vaping) for 24 hours prior to your procedure.  If you use a CPAP at night, you may bring your mask/headgear for your overnight stay.   You will be asked to remove any contacts, glasses,  piercing's, hearing aid's, dentures/partials prior to surgery. Please bring cases for these items if needed.    Patients discharged the day of surgery will not be allowed to drive home, and someone needs to stay with them for 24 hours.  SURGICAL WAITING ROOM VISITATION Patients may have no more than 2 support people in the waiting area - these visitors may rotate.   Pre-op nurse will coordinate an appropriate time for 1 ADULT support person, who may not rotate, to accompany patient in pre-op.  Children under the age of 54 must have an adult with them who is not the patient and must remain in the main waiting area with an adult.  If the patient needs to stay at the hospital during part of their recovery, the visitor guidelines for inpatient rooms apply.  Please refer to the [facility] website for the visitor guidelines for any additional information.   If you received a COVID test during your pre-op visit  it is requested that you wear a mask when out in public, stay away from anyone that may not be feeling well and notify your surgeon if you develop symptoms. If you have been in contact with anyone that has tested positive in the last 10 days please notify you surgeon.      Pre-operative 4 CHG Bathing Instructions   You can play a key role in reducing the risk of infection after surgery. Your skin needs to be as free of germs as possible. You can reduce the number of germs on your skin by washing with CHG (chlorhexidine  gluconate) soap before surgery. CHG is an antiseptic soap that kills germs and continues to kill germs even after washing.   DO NOT use if you have an allergy to chlorhexidine /CHG or antibacterial soaps. If your skin becomes reddened or irritated, stop using the CHG and notify one of our RNs at [phone].   Please shower with the CHG soap starting 4 days before surgery using the following schedule:     Please keep in mind the following:  DO NOT shave, including legs  and underarms, starting the day of your first shower.   Place clean sheets on your bed the day you start using CHG soap. Use a clean washcloth (not used since being washed) for each shower. DO NOT sleep with pets once you start using the CHG.   CHG Shower Instructions:  Wash your face and private area with normal soap. If you choose to wash your hair, wash first with your normal shampoo.  After you use shampoo/soap, rinse your hair and body thoroughly to remove shampoo/soap residue.  Turn the water OFF and apply  bottle of CHG soap to a CLEAN washcloth.  Apply CHG soap ONLY FROM YOUR NECK DOWN TO YOUR TOES (washing for 3-5 minutes)  DO NOT use CHG soap on face, private areas, open  wounds, or sores.  Pay special attention to the area where your surgery is being performed.  If you are having back surgery, having someone wash your back for you may be helpful. Wait 2 minutes after CHG soap is applied, then you may rinse off the CHG soap.  Pat dry with a clean towel  Put on clean clothes/pajamas   If you choose to wear lotion, please use ONLY the CHG-compatible lotions that are listed below.  Additional instructions for the day of surgery:  If you choose, you may shower the morning of surgery with an antibacterial soap.  DO NOT APPLY any lotions, deodorants  or perfumes.   Do not bring valuables to the hospital. [facility] is not responsible for any belongings/valuables. Do not wear nail polish, gel polish, artificial nails, or any other type of covering on natural nails (fingers and toes) Do not wear jewelry or makeup Put on clean/comfortable clothes.  Please brush your teeth.  Ask your nurse before applying any prescription medications to the skin.     CHG Compatible Lotions   Aveeno Moisturizing lotion  Cetaphil Moisturizing Cream  Cetaphil Moisturizing Lotion  Clairol Herbal Essence Moisturizing Lotion, Dry Skin  Clairol Herbal Essence Moisturizing Lotion, Extra Dry Skin   Clairol Herbal Essence Moisturizing Lotion, Normal Skin  Curel Age Defying Therapeutic Moisturizing Lotion with Alpha Hydroxy  Curel Extreme Care Body Lotion  Curel Soothing Hands Moisturizing Hand Lotion  Curel Therapeutic Moisturizing Cream, Fragrance-Free  Curel Therapeutic Moisturizing Lotion, Fragrance-Free  Curel Therapeutic Moisturizing Lotion, Original Formula  Eucerin Daily Replenishing Lotion  Eucerin Dry Skin Therapy Plus Alpha Hydroxy Crme  Eucerin Dry Skin Therapy Plus Alpha Hydroxy Lotion  Eucerin Original Crme  Eucerin Original Lotion  Eucerin Plus Crme Eucerin Plus Lotion  Eucerin TriLipid Replenishing Lotion  Keri Anti-Bacterial Hand Lotion  Keri Deep Conditioning Original Lotion Dry Skin Formula Softly Scented  Keri Deep Conditioning Original Lotion, Fragrance Free Sensitive Skin Formula  Keri Lotion Fast Absorbing Fragrance Free Sensitive Skin Formula  Keri Lotion Fast Absorbing Softly Scented Dry Skin Formula  Keri Original Lotion  Keri Skin Renewal Lotion Keri Silky Smooth Lotion  Keri Silky Smooth Sensitive Skin Lotion  Nivea Body Creamy Conditioning Oil  Nivea Body Extra Enriched Lotion  Nivea Body Original Lotion  Nivea Body Sheer Moisturizing Lotion Nivea Crme  Nivea Skin Firming Lotion  NutraDerm 30 Skin Lotion  NutraDerm Skin Lotion  NutraDerm Therapeutic Skin Cream  NutraDerm Therapeutic Skin Lotion  ProShield Protective Hand Cream  Provon moisturizing lotion  Please read over the following fact sheets that you were given.

## 2023-11-09 ENCOUNTER — Encounter (HOSPITAL_COMMUNITY): Payer: Self-pay

## 2023-11-09 ENCOUNTER — Other Ambulatory Visit: Payer: Self-pay

## 2023-11-09 ENCOUNTER — Encounter (HOSPITAL_COMMUNITY)
Admission: RE | Admit: 2023-11-09 | Discharge: 2023-11-09 | Disposition: A | Discharge status: Home / Self Care | Source: Ambulatory Visit | Attending: Neurosurgery | Admitting: Neurosurgery

## 2023-11-09 VITALS — BP 160/64 | HR 67 | Temp 98.7°F | Resp 16 | Ht 68.0 in | Wt 237.0 lb

## 2023-11-09 DIAGNOSIS — Z01812 Encounter for preprocedural laboratory examination: Secondary | ICD-10-CM | POA: Insufficient documentation

## 2023-11-09 DIAGNOSIS — I10 Essential (primary) hypertension: Secondary | ICD-10-CM | POA: Insufficient documentation

## 2023-11-09 DIAGNOSIS — Z01818 Encounter for other preprocedural examination: Secondary | ICD-10-CM

## 2023-11-09 DIAGNOSIS — Z9889 Other specified postprocedural states: Secondary | ICD-10-CM | POA: Insufficient documentation

## 2023-11-09 DIAGNOSIS — M4316 Spondylolisthesis, lumbar region: Secondary | ICD-10-CM | POA: Diagnosis not present

## 2023-11-09 DIAGNOSIS — E119 Type 2 diabetes mellitus without complications: Secondary | ICD-10-CM | POA: Insufficient documentation

## 2023-11-09 DIAGNOSIS — Z7985 Long-term (current) use of injectable non-insulin antidiabetic drugs: Secondary | ICD-10-CM | POA: Diagnosis not present

## 2023-11-09 DIAGNOSIS — Z7984 Long term (current) use of oral hypoglycemic drugs: Secondary | ICD-10-CM | POA: Insufficient documentation

## 2023-11-09 LAB — TYPE AND SCREEN
ABO/RH(D): A POS
Antibody Screen: NEGATIVE

## 2023-11-09 LAB — BASIC METABOLIC PANEL WITH GFR
Anion gap: 11 (ref 5–15)
BUN: 16 mg/dL (ref 8–23)
CO2: 26 mmol/L (ref 22–32)
Calcium: 8.8 mg/dL — ABNORMAL LOW (ref 8.9–10.3)
Chloride: 97 mmol/L — ABNORMAL LOW (ref 98–111)
Creatinine, Ser: 0.94 mg/dL (ref 0.44–1.00)
GFR, Estimated: >60 mL/min (ref >60)
Glucose, Bld: 124 mg/dL — ABNORMAL HIGH (ref 70–99)
Potassium: 3.9 mmol/L (ref 3.5–5.1)
Sodium: 134 mmol/L — ABNORMAL LOW (ref 135–145)

## 2023-11-09 LAB — CBC
HCT: 38.5 % (ref 36.0–46.0)
Hemoglobin: 12.7 g/dL (ref 12.0–15.0)
MCH: 28.7 pg (ref 26.0–34.0)
MCHC: 33 g/dL (ref 30.0–36.0)
MCV: 86.9 fL (ref 80.0–100.0)
Platelets: 262 K/uL (ref 150–400)
RBC: 4.43 MIL/uL (ref 3.87–5.11)
RDW: 13.1 % (ref 11.5–15.5)
WBC: 8.1 K/uL (ref 4.0–10.5)
nRBC: 0 % (ref 0.0–0.2)

## 2023-11-09 LAB — SURGICAL PCR SCREEN
MRSA, PCR: NEGATIVE
Staphylococcus aureus: POSITIVE — AB

## 2023-11-09 LAB — GLUCOSE, CAPILLARY: Glucose-Capillary: 129 mg/dL — ABNORMAL HIGH (ref 70–99)

## 2023-11-09 NOTE — Progress Notes (Signed)
 PCP - Norleen Marvine COME  Cardiologist -   PPM/ICD - denies Device Orders - n/a Rep Notified - n/a  Chest x-ray - denies EKG - 09-05-23 Stress Test - denies ECHO - denies Cardiac Cath - denies  Sleep Study - denies CPAP - n/a  Fasting Blood Sugar - Per patient blood sugar ranges 130 Checks Blood Sugar does not check them regularly. Blood sugar at PAT appointment 129  Last dose of GLP1 agonist-  OZEMPIC  GLP1 instructions: Last dose 10-24-23 A1c on 09-05-23 5.7  Blood Thinner Instructions:denies Aspirin Instructions: Last dose 10-24-23  ERAS Protcol -NPO  COVID TEST-   Anesthesia review: Yes, hx of DM, HTN. Patient denies any cough congestion temperature at this time  Patient denies shortness of breath, fever, cough and chest pain at PAT appointment   All instructions explained to the patient, with a verbal understanding of the material. Patient agrees to go over the instructions while at home for a better understanding. Patient also instructed to self quarantine after being tested for COVID-19. The opportunity to ask questions was provided.

## 2023-11-10 NOTE — Anesthesia Preprocedure Evaluation (Addendum)
 Anesthesia Evaluation  Patient identified by MRN, date of birth, ID band Patient awake    Reviewed: Allergy & Precautions, NPO status , Patient's Chart, lab work & pertinent test results  Airway Mallampati: II  TM Distance: >3 FB Neck ROM: Full    Dental no notable dental hx.    Pulmonary    Pulmonary exam normal        Cardiovascular hypertension, Pt. on medications and Pt. on home beta blockers Normal cardiovascular exam     Neuro/Psych    GI/Hepatic negative GI ROS, Neg liver ROS,,,  Endo/Other  diabetes  Patient on GLP-1 Agonist  Renal/GU negative Renal ROS     Musculoskeletal   Abdominal  (+) + obese  Peds  Hematology negative hematology ROS (+)   Anesthesia Other Findings SPONDYLOLISTHESIS, LUMBAR REGION  Reproductive/Obstetrics                              Anesthesia Physical Anesthesia Plan  ASA: 3  Anesthesia Plan: General   Post-op Pain Management:    Induction: Intravenous  PONV Risk Score and Plan: 2 and 3 and Ondansetron, Dexamethasone, Midazolam and Treatment may vary due to age or medical condition  Airway Management Planned: Oral ETT  Additional Equipment:   Intra-op Plan:   Post-operative Plan: Extubation in OR  Informed Consent: I have reviewed the patients History and Physical, chart, labs and discussed the procedure including the risks, benefits and alternatives for the proposed anesthesia with the patient or authorized representative who has indicated his/her understanding and acceptance.     Dental advisory given  Plan Discussed with: CRNA  Anesthesia Plan Comments: (PAT note written 11/10/2023 by Isaiah Ruder, PA-C.  )         Anesthesia Quick Evaluation

## 2023-11-10 NOTE — Progress Notes (Signed)
 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 Surgery was initially scheduled for 09/08/2023, but it was delayed after she tested positive for COVID on 09/05/2023. She had overall mild symptoms.    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    A1c 5.7% on 09/05/2023. She is on Janumet and Ozempic. Last Ozempic 10/24/2023.   ASA is on hold for surgery, last dose 10/24/2023   Anesthesia team to evaluate on the day of surgery.   VS: BP (!) 160/64   Pulse 67   Temp 37.1 C   Resp 16   Ht 5' 8 (1.727 m)   Wt 107.5 kg   SpO2 98%   BMI 36.04 kg/m   PROVIDERS: Marvine Rush, MD is PCP    LABS: Labs reviewed: Acceptable for surgery. (all labs ordered are listed, but only abnormal results are displayed)  Labs Reviewed  SURGICAL PCR SCREEN - Abnormal; Notable for the following components:      Result Value   Staphylococcus aureus POSITIVE (*)    All other components within normal limits  GLUCOSE, CAPILLARY - Abnormal; Notable for the following components:   Glucose-Capillary 129 (*)    All other components within normal limits  BASIC METABOLIC PANEL WITH GFR - Abnormal; Notable for the following components:   Sodium 134 (*)    Chloride 97 (*)    Glucose, Bld 124 (*)    Calcium 8.8 (*)    All other components within normal limits  CBC  TYPE AND  SCREEN     IMAGES: MRI L-spine 08/29/2023: IMPRESSION: 1. 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 2. Sigmoid colon diverticulosis.       EKG: 09/05/2023: Sinus rhythm with 1st degree A-V block Left axis deviation Minimal voltage criteria for LVH, may be normal variant ( Cornell product ) Abnormal ECG When compared with ECG of 11-Jun-2021 14:16, No significant change since last tracing Confirmed by Croitoru, Mihai [phone]) on 09/05/2023 8:32:28 PM - Overall, EKG appears stable when compared to 06/11/2021 tracing. PR interval 201->215ms.      CV: N/A    Past Medical History:  Diagnosis Date   Diabetes mellitus without complication (HCC)    Hypertension     Past Surgical History:  Procedure Laterality Date   arm surgery Left    DILATION AND CURETTAGE OF UTERUS     EYE SURGERY     FRACTURE SURGERY     SHOULDER SURGERY      MEDICATIONS:  amLODipine  (NORVASC ) 5 MG tablet   aspirin EC 81 MG tablet   carboxymethylcellulose (REFRESH PLUS) 0.5 % SOLN   metoprolol tartrate (LOPRESSOR) 50 MG tablet   olmesartan-hydrochlorothiazide (BENICAR HCT) 40-25 MG tablet   OZEMPIC, 2 MG/DOSE, 8 MG/3ML SOPN   potassium chloride  SA (KLOR-CON  M) 20 MEQ tablet   sitaGLIPtin-metformin (JANUMET) 50-1000 MG tablet  No current facility-administered medications for this encounter.    Isaiah Ruder, PA-C Surgical Short Stay/Anesthesiology [facility] Phone [phone] [facility] Phone [phone] 11/10/2023 6:28 PM

## 2023-11-14 ENCOUNTER — Ambulatory Visit (HOSPITAL_COMMUNITY): Payer: Self-pay | Admitting: Vascular Surgery

## 2023-11-14 ENCOUNTER — Ambulatory Visit (HOSPITAL_COMMUNITY)
Admission: RE | Admit: 2023-11-14 | Discharge: 2023-11-15 | Disposition: A | Discharge status: Home / Self Care | Attending: Neurosurgery | Admitting: Neurosurgery

## 2023-11-14 ENCOUNTER — Ambulatory Visit (HOSPITAL_COMMUNITY)

## 2023-11-14 ENCOUNTER — Other Ambulatory Visit: Payer: Self-pay

## 2023-11-14 ENCOUNTER — Ambulatory Visit (HOSPITAL_COMMUNITY): Payer: Self-pay | Admitting: Anesthesiology

## 2023-11-14 ENCOUNTER — Encounter (HOSPITAL_COMMUNITY)
Admission: RE | Disposition: A | Discharge status: Home / Self Care | Payer: Self-pay | Source: Home / Self Care | Attending: Neurosurgery

## 2023-11-14 ENCOUNTER — Encounter (HOSPITAL_COMMUNITY): Payer: Self-pay | Admitting: Neurosurgery

## 2023-11-14 DIAGNOSIS — Z7985 Long-term (current) use of injectable non-insulin antidiabetic drugs: Secondary | ICD-10-CM | POA: Insufficient documentation

## 2023-11-14 DIAGNOSIS — I10 Essential (primary) hypertension: Secondary | ICD-10-CM

## 2023-11-14 DIAGNOSIS — E119 Type 2 diabetes mellitus without complications: Secondary | ICD-10-CM | POA: Diagnosis not present

## 2023-11-14 DIAGNOSIS — M48062 Spinal stenosis, lumbar region with neurogenic claudication: Secondary | ICD-10-CM | POA: Diagnosis not present

## 2023-11-14 DIAGNOSIS — Z01818 Encounter for other preprocedural examination: Secondary | ICD-10-CM

## 2023-11-14 DIAGNOSIS — E1110 Type 2 diabetes mellitus with ketoacidosis without coma: Secondary | ICD-10-CM

## 2023-11-14 DIAGNOSIS — M5116 Intervertebral disc disorders with radiculopathy, lumbar region: Secondary | ICD-10-CM | POA: Insufficient documentation

## 2023-11-14 DIAGNOSIS — M4316 Spondylolisthesis, lumbar region: Secondary | ICD-10-CM

## 2023-11-14 DIAGNOSIS — Z7984 Long term (current) use of oral hypoglycemic drugs: Secondary | ICD-10-CM | POA: Diagnosis not present

## 2023-11-14 LAB — GLUCOSE, CAPILLARY
Glucose-Capillary: 140 mg/dL — ABNORMAL HIGH (ref 70–99)
Glucose-Capillary: 142 mg/dL — ABNORMAL HIGH (ref 70–99)
Glucose-Capillary: 205 mg/dL — ABNORMAL HIGH (ref 70–99)
Glucose-Capillary: 146 mg/dL — ABNORMAL HIGH (ref 70–99)
Glucose-Capillary: 172 mg/dL — ABNORMAL HIGH (ref 70–99)

## 2023-11-14 SURGERY — POSTERIOR LUMBAR FUSION 2 LEVEL
Anesthesia: General | Site: Back | Laterality: Bilateral

## 2023-11-14 MED ORDER — HYDROMORPHONE HCL 1 MG/ML IJ SOLN
INTRAMUSCULAR | Status: DC | PRN
  Administered 2023-11-14: .5 mg via INTRAVENOUS

## 2023-11-14 MED ORDER — SODIUM CHLORIDE 0.9 % IV SOLN
250.0000 mL | INTRAVENOUS | Status: DC
Start: 2023-11-14 — End: 2023-11-15
  Administered 2023-11-14: 250 mL via INTRAVENOUS

## 2023-11-14 MED ORDER — ACETAMINOPHEN 10 MG/ML IV SOLN: 1000.0000 mg | Freq: Once | INTRAVENOUS | Status: DC | PRN

## 2023-11-14 MED ORDER — ACETAMINOPHEN 500 MG PO TABS
1000.0000 mg | ORAL_TABLET | Freq: Four times a day (QID) | ORAL | Status: DC
  Administered 2023-11-14 – 2023-11-15 (×3): 1000 mg via ORAL
  Filled 2023-11-14 (×4): qty 2

## 2023-11-14 MED ORDER — ONDANSETRON HCL 4 MG/2ML IJ SOLN: 4.0000 mg | Freq: Four times a day (QID) | INTRAMUSCULAR | Status: DC | PRN

## 2023-11-14 MED ORDER — SUGAMMADEX SODIUM 200 MG/2ML IV SOLN
INTRAVENOUS | Status: DC | PRN
  Administered 2023-11-14: 200 mg via INTRAVENOUS

## 2023-11-14 MED ORDER — PROPOFOL 10 MG/ML IV BOLUS
INTRAVENOUS | Status: AC
  Filled 2023-11-14: qty 20

## 2023-11-14 MED ORDER — ONDANSETRON HCL 4 MG PO TABS: 4.0000 mg | ORAL_TABLET | Freq: Four times a day (QID) | ORAL | Status: DC | PRN

## 2023-11-14 MED ORDER — CYCLOBENZAPRINE HCL 10 MG PO TABS
10.0000 mg | ORAL_TABLET | Freq: Three times a day (TID) | ORAL | Status: DC | PRN
  Administered 2023-11-14: 10 mg via ORAL
  Filled 2023-11-14: qty 1

## 2023-11-14 MED ORDER — OXYCODONE HCL 5 MG PO TABS
5.0000 mg | ORAL_TABLET | ORAL | Status: DC | PRN
  Administered 2023-11-14 – 2023-11-15 (×2): 5 mg via ORAL
  Filled 2023-11-14: qty 1

## 2023-11-14 MED ORDER — CEFAZOLIN SODIUM-DEXTROSE 2-4 GM/100ML-% IV SOLN
2.0000 g | Freq: Three times a day (TID) | INTRAVENOUS | Status: AC
  Administered 2023-11-14 (×2): 2 g via INTRAVENOUS
  Filled 2023-11-14 (×2): qty 100

## 2023-11-14 MED ORDER — OLMESARTAN MEDOXOMIL-HCTZ 40-25 MG PO TABS: 1.0000 | ORAL_TABLET | Freq: Every day | ORAL | Status: DC

## 2023-11-14 MED ORDER — PHENYLEPHRINE 80 MCG/ML (10ML) SYRINGE FOR IV PUSH (FOR BLOOD PRESSURE SUPPORT)
PREFILLED_SYRINGE | INTRAVENOUS | Status: DC | PRN
  Administered 2023-11-14 (×2): 80 ug via INTRAVENOUS

## 2023-11-14 MED ORDER — LIDOCAINE 2% (20 MG/ML) 5 ML SYRINGE
INTRAMUSCULAR | Status: DC | PRN
  Administered 2023-11-14: 80 mg via INTRAVENOUS

## 2023-11-14 MED ORDER — FENTANYL CITRATE (PF) 100 MCG/2ML IJ SOLN
INTRAMUSCULAR | Status: AC
  Filled 2023-11-14: qty 2

## 2023-11-14 MED ORDER — BUPIVACAINE-EPINEPHRINE (PF) 0.5% -1:200000 IJ SOLN
INTRAMUSCULAR | Status: AC
Start: 2023-11-14 — End: 2023-11-14
  Filled 2023-11-14: qty 30

## 2023-11-14 MED ORDER — SITAGLIPTIN PHOS-METFORMIN HCL 50-1000 MG PO TABS: 1.0000 | ORAL_TABLET | Freq: Two times a day (BID) | ORAL | Status: DC

## 2023-11-14 MED ORDER — INSULIN ASPART 100 UNIT/ML IJ SOLN: 0.0000 [IU] | INTRAMUSCULAR | Status: DC

## 2023-11-14 MED ORDER — PROPOFOL 10 MG/ML IV BOLUS
INTRAVENOUS | Status: DC | PRN
  Administered 2023-11-14: 200 mg via INTRAVENOUS
  Administered 2023-11-14: 100 mg via INTRAVENOUS

## 2023-11-14 MED ORDER — CHLORHEXIDINE GLUCONATE 0.12 % MT SOLN: 15.0000 mL | Freq: Once | OROMUCOSAL | Status: AC

## 2023-11-14 MED ORDER — LACTATED RINGERS IV SOLN: INTRAVENOUS | Status: DC | PRN

## 2023-11-14 MED ORDER — CEFAZOLIN SODIUM-DEXTROSE 2-4 GM/100ML-% IV SOLN
2.0000 g | INTRAVENOUS | Status: AC
  Administered 2023-11-14: 2 g via INTRAVENOUS

## 2023-11-14 MED ORDER — MORPHINE SULFATE (PF) 2 MG/ML IV SOLN: 2.0000 mg | INTRAVENOUS | Status: DC | PRN

## 2023-11-14 MED ORDER — ZOLPIDEM TARTRATE 5 MG PO TABS: 5.0000 mg | ORAL_TABLET | Freq: Every evening | ORAL | Status: DC | PRN

## 2023-11-14 MED ORDER — THROMBIN 5000 UNITS EX SOLR: OROMUCOSAL | Status: DC | PRN

## 2023-11-14 MED ORDER — BUPIVACAINE LIPOSOME 1.3 % IJ SUSP
INTRAMUSCULAR | Status: DC | PRN
  Administered 2023-11-14: 20 mL

## 2023-11-14 MED ORDER — HYDROMORPHONE HCL 1 MG/ML IJ SOLN
INTRAMUSCULAR | Status: AC
  Filled 2023-11-14: qty 0.5

## 2023-11-14 MED ORDER — SODIUM CHLORIDE 0.9% FLUSH: 3.0000 mL | INTRAVENOUS | Status: DC | PRN

## 2023-11-14 MED ORDER — DEXAMETHASONE SOD PHOSPHATE PF 10 MG/ML IJ SOLN
INTRAMUSCULAR | Status: DC | PRN
  Administered 2023-11-14: 5 mg via INTRAVENOUS

## 2023-11-14 MED ORDER — ALBUMIN HUMAN 5 % IV SOLN: INTRAVENOUS | Status: DC | PRN

## 2023-11-14 MED ORDER — MENTHOL 3 MG MT LOZG: 1.0000 | LOZENGE | OROMUCOSAL | Status: DC | PRN

## 2023-11-14 MED ORDER — HYDROCHLOROTHIAZIDE 25 MG PO TABS: 25.0000 mg | ORAL_TABLET | Freq: Every day | ORAL | Status: DC

## 2023-11-14 MED ORDER — ONDANSETRON HCL 4 MG/2ML IJ SOLN: 4.0000 mg | Freq: Once | INTRAMUSCULAR | Status: DC | PRN

## 2023-11-14 MED ORDER — EPHEDRINE SULFATE-NACL 50-0.9 MG/10ML-% IV SOSY
PREFILLED_SYRINGE | INTRAVENOUS | Status: DC | PRN
  Administered 2023-11-14: 5 mg via INTRAVENOUS

## 2023-11-14 MED ORDER — POTASSIUM CHLORIDE CRYS ER 20 MEQ PO TBCR: 20.0000 meq | EXTENDED_RELEASE_TABLET | Freq: Every day | ORAL | Status: DC

## 2023-11-14 MED ORDER — ACETAMINOPHEN 650 MG RE SUPP: 650.0000 mg | RECTAL | Status: DC | PRN

## 2023-11-14 MED ORDER — PHENOL 1.4 % MT LIQD: 1.0000 | OROMUCOSAL | Status: DC | PRN

## 2023-11-14 MED ORDER — INSULIN ASPART 100 UNIT/ML IJ SOLN: 0.0000 [IU] | INTRAMUSCULAR | Status: DC | PRN

## 2023-11-14 MED ORDER — CHLORHEXIDINE GLUCONATE CLOTH 2 % EX PADS
6.0000 | MEDICATED_PAD | Freq: Once | CUTANEOUS | Status: AC
  Administered 2023-11-14: 6 via TOPICAL

## 2023-11-14 MED ORDER — ORAL CARE MOUTH RINSE: 15.0000 mL | Freq: Once | OROMUCOSAL | Status: AC

## 2023-11-14 MED ORDER — THROMBIN 5000 UNITS EX KIT
PACK | CUTANEOUS | Status: AC
  Filled 2023-11-14: qty 1

## 2023-11-14 MED ORDER — CHLORHEXIDINE GLUCONATE CLOTH 2 % EX PADS
6.0000 | MEDICATED_PAD | Freq: Once | CUTANEOUS | Status: DC
Start: 2023-11-14 — End: 2023-11-14

## 2023-11-14 MED ORDER — BACITRACIN ZINC 500 UNIT/GM EX OINT
TOPICAL_OINTMENT | CUTANEOUS | Status: DC | PRN
  Administered 2023-11-14: 1 via TOPICAL

## 2023-11-14 MED ORDER — LACTATED RINGERS IV SOLN: INTRAVENOUS | Status: DC

## 2023-11-14 MED ORDER — 0.9 % SODIUM CHLORIDE (POUR BTL) OPTIME
TOPICAL | Status: DC | PRN
  Administered 2023-11-14: 1000 mL

## 2023-11-14 MED ORDER — PHENYLEPHRINE HCL-NACL 20-0.9 MG/250ML-% IV SOLN
INTRAVENOUS | Status: DC | PRN
  Administered 2023-11-14: 35 ug/min via INTRAVENOUS

## 2023-11-14 MED ORDER — OXYCODONE HCL 5 MG PO TABS
ORAL_TABLET | ORAL | Status: AC
  Filled 2023-11-14: qty 1

## 2023-11-14 MED ORDER — BACITRACIN ZINC 500 UNIT/GM EX OINT
TOPICAL_OINTMENT | CUTANEOUS | Status: AC
Start: 2023-11-14 — End: 2023-11-14
  Filled 2023-11-14: qty 28.35

## 2023-11-14 MED ORDER — AMLODIPINE BESYLATE 5 MG PO TABS: 5.0000 mg | ORAL_TABLET | Freq: Every day | ORAL | Status: DC

## 2023-11-14 MED ORDER — ACETAMINOPHEN 325 MG PO TABS: 650.0000 mg | ORAL_TABLET | ORAL | Status: DC | PRN

## 2023-11-14 MED ORDER — FENTANYL CITRATE (PF) 250 MCG/5ML IJ SOLN
INTRAMUSCULAR | Status: DC | PRN
  Administered 2023-11-14: 150 ug via INTRAVENOUS
  Administered 2023-11-14: 100 ug via INTRAVENOUS

## 2023-11-14 MED ORDER — CEFAZOLIN SODIUM-DEXTROSE 2-4 GM/100ML-% IV SOLN
INTRAVENOUS | Status: AC
  Filled 2023-11-14: qty 100

## 2023-11-14 MED ORDER — VASHE WOUND IRRIGATION OPTIME
TOPICAL | Status: DC | PRN
  Administered 2023-11-14: 34 [oz_av]

## 2023-11-14 MED ORDER — ROCURONIUM BROMIDE 10 MG/ML (PF) SYRINGE
PREFILLED_SYRINGE | INTRAVENOUS | Status: DC | PRN
  Administered 2023-11-14 (×2): 20 mg via INTRAVENOUS
  Administered 2023-11-14: 60 mg via INTRAVENOUS
  Administered 2023-11-14: 20 mg via INTRAVENOUS
  Administered 2023-11-14: 40 mg via INTRAVENOUS
  Administered 2023-11-14: 10 mg via INTRAVENOUS

## 2023-11-14 MED ORDER — INSULIN ASPART 100 UNIT/ML IJ SOLN: 0.0000 [IU] | Freq: Three times a day (TID) | INTRAMUSCULAR | Status: DC

## 2023-11-14 MED ORDER — BUPIVACAINE-EPINEPHRINE (PF) 0.5% -1:200000 IJ SOLN
INTRAMUSCULAR | Status: DC | PRN
  Administered 2023-11-14: 10 mL

## 2023-11-14 MED ORDER — FENTANYL CITRATE (PF) 100 MCG/2ML IJ SOLN
25.0000 ug | INTRAMUSCULAR | Status: DC | PRN
  Administered 2023-11-14 (×3): 25 ug via INTRAVENOUS

## 2023-11-14 MED ORDER — BUPIVACAINE LIPOSOME 1.3 % IJ SUSP
INTRAMUSCULAR | Status: AC
Start: 2023-11-14 — End: 2023-11-14
  Filled 2023-11-14: qty 20

## 2023-11-14 MED ORDER — METFORMIN HCL 500 MG PO TABS
1000.0000 mg | ORAL_TABLET | Freq: Two times a day (BID) | ORAL | Status: DC
  Administered 2023-11-14 – 2023-11-15 (×2): 1000 mg via ORAL
  Filled 2023-11-14 (×2): qty 2

## 2023-11-14 MED ORDER — OXYCODONE HCL 5 MG PO TABS
10.0000 mg | ORAL_TABLET | ORAL | Status: DC | PRN
  Administered 2023-11-14 (×2): 10 mg via ORAL
  Filled 2023-11-14 (×2): qty 2

## 2023-11-14 MED ORDER — DOCUSATE SODIUM 100 MG PO CAPS
100.0000 mg | ORAL_CAPSULE | Freq: Two times a day (BID) | ORAL | Status: DC
  Administered 2023-11-14 (×2): 100 mg via ORAL
  Filled 2023-11-14 (×2): qty 1

## 2023-11-14 MED ORDER — SODIUM CHLORIDE 0.9% FLUSH
3.0000 mL | Freq: Two times a day (BID) | INTRAVENOUS | Status: DC
Start: 2023-11-14 — End: 2023-11-15
  Administered 2023-11-14: 3 mL via INTRAVENOUS

## 2023-11-14 MED ORDER — CHLORHEXIDINE GLUCONATE 0.12 % MT SOLN
OROMUCOSAL | Status: AC
  Administered 2023-11-14: 15 mL via OROMUCOSAL
  Filled 2023-11-14: qty 15

## 2023-11-14 MED ORDER — METOPROLOL TARTRATE 50 MG PO TABS
50.0000 mg | ORAL_TABLET | Freq: Two times a day (BID) | ORAL | Status: DC
  Administered 2023-11-14: 50 mg via ORAL
  Filled 2023-11-14: qty 1

## 2023-11-14 MED ORDER — INSULIN ASPART 100 UNIT/ML IJ SOLN: 0.0000 [IU] | Freq: Every day | INTRAMUSCULAR | Status: DC

## 2023-11-14 MED ORDER — FENTANYL CITRATE (PF) 250 MCG/5ML IJ SOLN
INTRAMUSCULAR | Status: AC
  Filled 2023-11-14: qty 5

## 2023-11-14 MED ORDER — LINAGLIPTIN 5 MG PO TABS
5.0000 mg | ORAL_TABLET | Freq: Every day | ORAL | Status: DC
  Administered 2023-11-15: 5 mg via ORAL
  Filled 2023-11-14: qty 1

## 2023-11-14 MED ORDER — IRBESARTAN 150 MG PO TABS: 300.0000 mg | ORAL_TABLET | Freq: Every day | ORAL | Status: DC

## 2023-11-14 MED ORDER — BISACODYL 10 MG RE SUPP
10.0000 mg | Freq: Every day | RECTAL | Status: DC | PRN
Start: 2023-11-14 — End: 2023-11-15

## 2023-11-14 MED ORDER — ACETAMINOPHEN 10 MG/ML IV SOLN
INTRAVENOUS | Status: DC | PRN
Start: 2023-11-14 — End: 2023-11-14
  Administered 2023-11-14: 1000 mg via INTRAVENOUS

## 2023-11-14 MED ORDER — AMISULPRIDE (ANTIEMETIC) 5 MG/2ML IV SOLN: 10.0000 mg | Freq: Once | INTRAVENOUS | Status: DC | PRN

## 2023-11-14 SURGICAL SUPPLY — 59 items
BAG COUNTER SPONGE SURGICOUNT (BAG) ×1 IMPLANT
BASKET BONE COLLECTION (BASKET) ×1 IMPLANT
BENZOIN TINCTURE PRP APPL 2/3 (GAUZE/BANDAGES/DRESSINGS) ×1 IMPLANT
BLADE CLIPPER SURG (BLADE) IMPLANT
BUR MATCHSTICK NEURO 3.0 LAGG (BURR) ×1 IMPLANT
BUR PRECISION FLUTE 6.0 (BURR) ×1 IMPLANT
CANISTER SUCTION 3000ML PPV (SUCTIONS) ×1 IMPLANT
CAP LOCK DLX THRD (Cap) IMPLANT
CLEANSER WND VASHE INSTL 34OZ (WOUND CARE) ×1 IMPLANT
CNTNR URN SCR LID CUP LEK RST (MISCELLANEOUS) ×1 IMPLANT
COVER BACK TABLE 60X90IN (DRAPES) ×1 IMPLANT
DRAPE C-ARM 42X72 X-RAY (DRAPES) ×2 IMPLANT
DRAPE HALF SHEET 40X57 (DRAPES) ×1 IMPLANT
DRAPE LAPAROTOMY 100X72X124 (DRAPES) ×1 IMPLANT
DRAPE SURG 17X23 STRL (DRAPES) ×1 IMPLANT
DRSG OPSITE POSTOP 4X6 (GAUZE/BANDAGES/DRESSINGS) ×1 IMPLANT
DRSG OPSITE POSTOP 4X8 (GAUZE/BANDAGES/DRESSINGS) IMPLANT
ELECTRODE BLDE 4.0 EZ CLN MEGD (MISCELLANEOUS) ×1 IMPLANT
ELECTRODE REM PT RTRN 9FT ADLT (ELECTROSURGICAL) ×1 IMPLANT
EVACUATOR 1/8 PVC DRAIN (DRAIN) ×1 IMPLANT
GAUZE 4X4 16PLY ~~LOC~~+RFID DBL (SPONGE) ×1 IMPLANT
GLOVE BIO SURGEON STRL SZ 6 (GLOVE) ×1 IMPLANT
GLOVE BIO SURGEON STRL SZ8 (GLOVE) ×2 IMPLANT
GLOVE BIO SURGEON STRL SZ8.5 (GLOVE) ×2 IMPLANT
GLOVE BIOGEL PI IND STRL 6.5 (GLOVE) ×1 IMPLANT
GOWN STRL REUS W/ TWL LRG LVL3 (GOWN DISPOSABLE) ×1 IMPLANT
GOWN STRL REUS W/ TWL XL LVL3 (GOWN DISPOSABLE) ×2 IMPLANT
GOWN STRL REUS W/TWL 2XL LVL3 (GOWN DISPOSABLE) IMPLANT
HEMOSTAT POWDER KIT SURGIFOAM (HEMOSTASIS) ×1 IMPLANT
KIT BASIN OR (CUSTOM PROCEDURE TRAY) ×1 IMPLANT
KIT GRAFTMAG DEL NEURO DISP (NEUROSURGERY SUPPLIES) IMPLANT
KIT POSITIONER JACKSON TABLE (MISCELLANEOUS) ×1 IMPLANT
KIT TURNOVER KIT B (KITS) ×1 IMPLANT
NDL HYPO 21X1.5 SAFETY (NEEDLE) ×1 IMPLANT
NDL HYPO 22X1.5 SAFETY MO (MISCELLANEOUS) ×1 IMPLANT
NEEDLE HYPO 21X1.5 SAFETY (NEEDLE) ×1 IMPLANT
NEEDLE HYPO 22X1.5 SAFETY MO (MISCELLANEOUS) ×1 IMPLANT
PACK LAMINECTOMY NEURO (CUSTOM PROCEDURE TRAY) ×1 IMPLANT
PAD ARMBOARD POSITIONER FOAM (MISCELLANEOUS) ×3 IMPLANT
PATTIES SURGICAL .5 X1 (DISPOSABLE) IMPLANT
PUTTY DBM 5CC CALC GRAN (Putty) IMPLANT
ROD CREO DLX CVD 6.35X40 (Rod) IMPLANT
SCREW PA DLX CREO 7.5X50 (Screw) IMPLANT
SCREW PA DLX CREO 7.5X55 (Screw) IMPLANT
SOLN 0.9% NACL POUR BTL 1000ML (IV SOLUTION) ×1 IMPLANT
SOLN STERILE WATER BTL 1000 ML (IV SOLUTION) ×1 IMPLANT
SPACER ALTERA 10X31 8-12MM-8 (Spacer) IMPLANT
SPIKE FLUID TRANSFER (MISCELLANEOUS) ×1 IMPLANT
SPONGE NEURO XRAY DETECT 1X3 (DISPOSABLE) IMPLANT
SPONGE SURGIFOAM ABS GEL 100 (HEMOSTASIS) IMPLANT
SPONGE T-LAP 4X18 ~~LOC~~+RFID (SPONGE) IMPLANT
STRIP CLOSURE SKIN 1/2X4 (GAUZE/BANDAGES/DRESSINGS) ×1 IMPLANT
SUT VIC AB 1 CT1 18XBRD ANBCTR (SUTURE) ×2 IMPLANT
SUT VIC AB 2-0 CP2 18 (SUTURE) ×2 IMPLANT
SYR 20ML LL LF (SYRINGE) IMPLANT
TOWEL GREEN STERILE (TOWEL DISPOSABLE) ×1 IMPLANT
TOWEL GREEN STERILE FF (TOWEL DISPOSABLE) ×1 IMPLANT
TRAY FOLEY MTR SLVR 16FR STAT (SET/KITS/TRAYS/PACK) ×1 IMPLANT
TRAY FOLEY W/BAG SLVR 14FR (SET/KITS/TRAYS/PACK) IMPLANT

## 2023-11-14 NOTE — H&P (Signed)
 Subjective: The patient is a 68 year old white female who has complained of back and left greater right leg pain consistent with neurogenic claudication.  She has failed medical management and was worked up with a lumbar MRI which demonstrated multilevel spinal stenosis and a spondylolisthesis at L4-5.  I discussed the various treatment options with her.  She has decided proceed with surgery.  Past Medical History:  Diagnosis Date   Diabetes mellitus without complication (HCC)    Hypertension     Past Surgical History:  Procedure Laterality Date   arm surgery Left    DILATION AND CURETTAGE OF UTERUS     EYE SURGERY     FRACTURE SURGERY     SHOULDER SURGERY      No Known Allergies  Social History   Tobacco Use   Smoking status: Never   Smokeless tobacco: Never  Substance Use Topics   Alcohol use: Yes    Comment: occasionally    Family History  Problem Relation Age of Onset   Heart attack Father    Breast cancer Mother    Lung cancer Sister    Prior to Admission medications   Medication Sig Start Date End Date Taking? Authorizing Provider  amLODipine  (NORVASC ) 5 MG tablet Take 1 tablet (5 mg total) by mouth daily. 06/11/21  Yes Lorriane Holmes, MD  aspirin EC 81 MG tablet Take 81 mg by mouth daily. Swallow whole.   Yes [provider]  carboxymethylcellulose (REFRESH PLUS) 0.5 % SOLN Place 1 drop into both eyes in the morning and at bedtime.   Yes [provider]  metoprolol tartrate (LOPRESSOR) 50 MG tablet Take 50 mg by mouth 2 (two) times daily. 03/21/21  Yes [provider]  olmesartan-hydrochlorothiazide (BENICAR HCT) 40-25 MG tablet Take 1 tablet by mouth daily. 12/07/16  Yes [provider]  sitaGLIPtin-metformin (JANUMET) 50-1000 MG tablet Take 1 tablet by mouth 2 (two) times daily with a meal.   Yes [provider]  OZEMPIC, 2 MG/DOSE, 8 MG/3ML SOPN Inject 2 mg into the skin once a week. 06/20/23   [provider]   potassium chloride  SA (KLOR-CON  M) 20 MEQ tablet Take 1 tablet (20 mEq total) by mouth daily. Patient not taking: Reported on 09/01/2023 06/11/21   Lorriane Holmes, MD     Review of Systems  Positive ROS: As above  All other systems have been reviewed and were otherwise negative with the exception of those mentioned in the HPI and as above.  Objective: Vital signs in last 24 hours: Temp:  [98.2 F (36.8 C)] 98.2 F (36.8 C) (11/03 0548) Pulse Rate:  [71] 71 (11/03 0548) Resp:  [18] 18 (11/03 0548) BP: (173)/(84) 173/84 (11/03 0548) SpO2:  [98 %] 98 % (11/03 0548) Weight:  [101.2 kg] 101.2 kg (11/03 0548) Estimated body mass index is 33.91 kg/m as calculated from the following:   Height as of this encounter: 5' 8 (1.727 m).   Weight as of this encounter: 101.2 kg.   General Appearance: Alert Head: Normocephalic, without obvious abnormality, atraumatic Eyes: PERRL, conjunctiva/corneas clear, EOM's intact,    Ears: Normal  Throat: Normal  Neck: Supple, Back: unremarkable Lungs: Clear to auscultation bilaterally, respirations unlabored Heart: Regular rate and rhythm, no murmur, rub or gallop Abdomen: Soft, non-tender Extremities: Extremities normal, atraumatic, no cyanosis or edema Skin: unremarkable  NEUROLOGIC:   Mental status: alert and oriented,Motor Exam - grossly normal Sensory Exam - grossly normal Reflexes:  Coordination - grossly normal Gait - grossly  normal Balance - grossly normal Cranial Nerves: I: smell Not tested  II: visual acuity  OS: Normal  OD: Normal   II: visual fields Full to confrontation  II: pupils Equal, round, reactive to light  III,VII: ptosis None  III,IV,VI: extraocular muscles  Full ROM  V: mastication Normal  V: facial light touch sensation  Normal  V,VII: corneal reflex  Present  VII: facial muscle function - upper  Normal  VII: facial muscle function - lower Normal  VIII: hearing Not tested  IX: soft palate elevation  Normal   IX,X: gag reflex Present  XI: trapezius strength  5/5  XI: sternocleidomastoid strength 5/5  XI: neck flexion strength  5/5  XII: tongue strength  Normal    Data Review Lab Results  Component Value Date   WBC 8.1 11/09/2023   HGB 12.7 11/09/2023   HCT 38.5 11/09/2023   MCV 86.9 11/09/2023   PLT 262 11/09/2023   Lab Results  Component Value Date   NA 134 (L) 11/09/2023   K 3.9 11/09/2023   CL 97 (L) 11/09/2023   CO2 26 11/09/2023   BUN 16 11/09/2023   CREATININE 0.94 11/09/2023   GLUCOSE 124 (H) 11/09/2023   No results found for: INR, PROTIME  Assessment/Plan: Lumbar spondylolisthesis, lumbar spinal stenosis, lumbar facet arthropathy, lumbar radiculopathy, neurogenic claudication, lumbago: I have discussed the situation with the patient.  I reviewed her imaging studies with her and pointed out the abnormalities.  We have discussed the various treatment options including surgery.  I have described the surgical treatment option of decompressive laminotomies at L2-3, L3-4 and L4-5 with instrumentation and fusion at L4-5.  I have shown her surgical models.  I have given her a surgical pamphlet.  We have discussed the risk, benefits, alternatives, expected postoperative course, and likelihood of achieving her goals with surgery.  I have answered all her questions.  She has decided proceed with surgery.   Jennifer Carey 11/14/2023 7:17 AM

## 2023-11-14 NOTE — Anesthesia Procedure Notes (Signed)
 Procedure Name: Intubation Date/Time: 11/14/2023 7:40 AM  Performed by: Arvell Edsel HERO, CRNAPre-anesthesia Checklist: Patient identified, Emergency Drugs available, Suction available, Patient being monitored and Timeout performed Patient Re-evaluated:Patient Re-evaluated prior to induction Oxygen Delivery Method: Circle system utilized Preoxygenation: Pre-oxygenation with 100% oxygen Induction Type: IV induction Ventilation: Mask ventilation without difficulty and Oral airway inserted - appropriate to patient size Laryngoscope Size: 3 and Mac Grade View: Grade I Tube type: Oral Tube size: 7.0 mm Number of attempts: 1 Airway Equipment and Method: Patient positioned with wedge pillow and Stylet Placement Confirmation: ETT inserted through vocal cords under direct vision, positive ETCO2 and breath sounds checked- equal and bilateral Secured at: 23 cm Tube secured with: Tape Dental Injury: Teeth and Oropharynx as per pre-operative assessment

## 2023-11-14 NOTE — Op Note (Signed)
 Brief history: The patient is a 68-year-old white female who is complaining of back and left greater right leg pain consistent with neurogenic claudication/lumbar radiculopathy.  She has failed medical management and was worked up with a lumbar MRI and lumbar x-rays which demonstrated lumbar spinal stenosis and a lumbar spondylolisthesis.  I discussed the various treatment options with her.  She has decided to proceed with surgery.  Preoperative diagnosis: Lumbar spondylolisthesis, lumbar facet arthropathy, lumbar degenerative disc disease, spinal stenosis compressing the L3, L4 and L5 nerve roots; lumbago; lumbar radiculopathy; neurogenic claudication  Postoperative diagnosis: The same  Procedure: Bilateral L2-3, L3-4 and L4-5 laminotomy/foraminotomies/medial facetectomy to decompress the bilateral L3, L4 and L5 nerve roots(the work required to do this was in addition to the work required to do the posterior lumbar interbody fusion because of the patient's spinal stenosis, facet arthropathy. Etc. requiring a wide decompression of the nerve roots.); left L4-5 transforaminal lumbar interbody fusion with local morselized autograft bone and Zimmer DBM; insertion of interbody prosthesis at L4-5 (globus peek expandable interbody prosthesis); posterior nonsegmental instrumentation from L4 to L5 with globus titanium pedicle screws and rods; posterior lateral arthrodesis at L4-5 with local morselized autograft bone and Zimmer DBM.  Surgeon: Dr. Chyrl Budge  Asst.: Duwaine Beck, NP  Anesthesia: Gen. endotracheal  Estimated blood loss: 250 cc  Drains: Medium Hemovac drain in the epidural space  Complications: None  Description of procedure: The patient was brought to the operating room by the anesthesia team. General endotracheal anesthesia was induced. The patient was turned to the prone position on the Wilson frame. The patient's lumbosacral region was then prepared with Betadine scrub and Betadine  solution. Sterile drapes were applied.  I then injected the area to be incised with Marcaine with epinephrine solution. I then used the scalpel to make a linear midline incision over the L2-3, L3-4 and L4-5 interspace. I then used electrocautery to perform a bilateral subperiosteal dissection exposing the spinous process and lamina of L2-3, L3-4 and L4-5. We then obtained intraoperative radiograph to confirm our location. We then inserted the Verstrac retractor to provide exposure.  I began the decompression by using the high speed drill to perform laminotomies at L2-3 and L3-4 on the left and L4-5 bilaterally. We then used the Kerrison punches to widen the laminotomy and removed the ligamentum flavum at L2-3, L3-4 and L4-5 on the left and L4-5 bilaterally.  I then drilled across the midline at L2-3 and L3-4.  I then removed the right L2-3 and L3-4 ligamentum flavum we used the Kerrison punches.  I used the Kerrison punch to remove the left L4-5 facet.. We performed wide foraminotomies about the bilateral L3, L4 and L5 nerve roots completing the decompression.  We now turned our attention to the posterior lumbar interbody fusion. I used a scalpel to incise the intervertebral disc at L4-5 bilaterally. I then performed a partial intervertebral discectomy at L4-5 bilaterally using the pituitary forceps. We prepared the vertebral endplates at L4-5 bilaterally for the fusion by removing the soft tissues with the curettes. We then used the trial spacers to pick the appropriate sized interbody prosthesis. We prefilled his prosthesis with a combination of local morselized autograft bone that we obtained during the decompression as well as Zimmer DBM. We inserted the prefilled prosthesis into the interspace at L4-5 from the left, we then turned and expanded the prosthesis. There was a good snug fit of the prosthesis in the interspace. We then filled and the remainder of the intervertebral  disc space with local  morselized autograft bone and Zimmer DBM. This completed the posterior lumbar interbody arthrodesis.  During the decompression and insertion of the prosthesis the assistant protected the thecal sac and nerve roots with the D'Errico retractor.  We now turned attention to the instrumentation. Under fluoroscopic guidance we cannulated the bilateral L4 and L5 pedicles with the bone probe. We then removed the bone probe. We then tapped the pedicle with a 6.5 millimeter tap. We then removed the tap. We probed inside the tapped pedicle with a ball probe to rule out cortical breaches. We then inserted a 7.5 x 50 and 55 millimeter pedicle screw into the L4 and L5 pedicles bilaterally under fluoroscopic guidance. We then palpated along the medial aspect of the pedicles to rule out cortical breaches. There were none. The nerve roots were not injured. We then connected the unilateral pedicle screws with a lordotic rod. We compressed the construct and secured the rod in place with the caps. We then tightened the caps appropriately. This completed the instrumentation from L4-5 bilaterally.  We now turned our attention to the posterior lateral arthrodesis at L4-5. We used the high-speed drill to decorticate the remainder of the facets, pars, transverse process at L4-5. We then applied a combination of local morselized autograft bone and Zimmer DBM over these decorticated posterior lateral structures. This completed the posterior lateral arthrodesis.  We then obtained hemostasis using bipolar electrocautery. We irrigated the wound out with vashe solution. We inspected the thecal sac and nerve roots and noted they were well decompressed. We then removed the retractor.  We injected Exparel .  We placed a medium Hemovac drain in the epidural space and tunneled it out through a separate stab wound.  We reapproximated patient's thoracolumbar fascia with interrupted #1 Vicryl suture. We reapproximated patient's subcutaneous tissue  with interrupted 2-0 Vicryl suture. The reapproximated patient's skin with Steri-Strips and benzoin. The wound was then coated with bacitracin ointment. A sterile dressing was applied. The drapes were removed. The patient was subsequently returned to the supine position where they were extubated by the anesthesia team. He was then transported to the post anesthesia care unit in stable condition. All sponge instrument and needle counts were reportedly correct at the end of this case.

## 2023-11-14 NOTE — Transfer of Care (Signed)
 Immediate Anesthesia Transfer of Care Note  Patient: Jennifer Carey  Procedure(s) Performed: POSTERIOR LUMBAR FUSION 2 LEVEL (Bilateral: Back)  Patient Location: PACU  Anesthesia Type:General  Level of Consciousness: awake, alert , oriented, and patient cooperative  Airway & Oxygen Therapy: Patient Spontanous Breathing and Patient connected to face mask oxygen  Post-op Assessment: Report given to RN, Post -op Vital signs reviewed and stable, Patient moving all extremities, and Patient moving all extremities X 4  Post vital signs: Reviewed and stable  Last Vitals:  Vitals Value Taken Time  BP 151/70 11/14/23 12:20  Temp 36.5 C 11/14/23 12:20  Pulse 62 11/14/23 12:27  Resp 17 11/14/23 12:27  SpO2 100 % 11/14/23 12:27  Vitals shown include unfiled device data.  Last Pain:  Vitals:   11/14/23 0645  TempSrc:   PainSc: 0-No pain         Complications: No notable events documented.

## 2023-11-14 NOTE — Progress Notes (Signed)
 Orthopedic Tech Progress Note Patient Details:  Jennifer Carey 01/09/1956 [id number]  Ortho Devices Type of Ortho Device: Lumbar corsett Ortho Device/Splint Location: BACK Ortho Device/Splint Interventions: Ordered, Adjustment   Post Interventions Patient Tolerated: Well Instructions Provided: Care of device  [name] Pac 11/14/2023, 3:57 PM

## 2023-11-15 DIAGNOSIS — M4316 Spondylolisthesis, lumbar region: Secondary | ICD-10-CM | POA: Diagnosis not present

## 2023-11-15 LAB — GLUCOSE, CAPILLARY: Glucose-Capillary: 118 mg/dL — ABNORMAL HIGH (ref 70–99)

## 2023-11-15 MED ORDER — MUPIROCIN 2 % EX OINT
TOPICAL_OINTMENT | Freq: Two times a day (BID) | CUTANEOUS | Status: DC
Start: 2023-11-15 — End: 2023-11-15
  Filled 2023-11-15: qty 22

## 2023-11-15 MED ORDER — OXYCODONE-ACETAMINOPHEN 5-325 MG PO TABS: 1.0000 | ORAL_TABLET | ORAL | 0 refills | Status: AC | PRN

## 2023-11-15 MED ORDER — CYCLOBENZAPRINE HCL 5 MG PO TABS
5.0000 mg | ORAL_TABLET | Freq: Three times a day (TID) | ORAL | Status: DC | PRN
  Administered 2023-11-15: 5 mg via ORAL
  Filled 2023-11-15: qty 1

## 2023-11-15 MED ORDER — DOCUSATE SODIUM 100 MG PO CAPS: 100.0000 mg | ORAL_CAPSULE | Freq: Two times a day (BID) | ORAL | 0 refills | Status: AC

## 2023-11-15 MED ORDER — OXYCODONE-ACETAMINOPHEN 5-325 MG PO TABS
1.0000 | ORAL_TABLET | ORAL | Status: DC | PRN
Start: 2023-11-15 — End: 2023-11-15
  Administered 2023-11-15: 2 via ORAL
  Filled 2023-11-15: qty 2

## 2023-11-15 MED ORDER — CYCLOBENZAPRINE HCL 5 MG PO TABS: 5.0000 mg | ORAL_TABLET | Freq: Three times a day (TID) | ORAL | 0 refills | Status: AC | PRN

## 2023-11-15 NOTE — Progress Notes (Signed)
 Patient alert and oriented, voided, ambulate. Surgical site clean and dry no sign of infection. D/c instructions explain and given to the patient all questions answered.

## 2023-11-15 NOTE — Discharge Summary (Signed)
 Physician Discharge Summary  Patient ID: Jennifer Carey MRN: [id number] DOB/AGE: [birth year] 68 y.o.  Admit date: 11/14/2023 Discharge date: 11/15/2023  Admission Diagnoses: Lumbar spondylolisthesis, lumbar facet arthropathy, lumbar spinal stenosis, lumbar radiculopathy, neurogenic claudication, lumbago  Discharge Diagnoses: The same Principal Problem:   Spondylolisthesis of lumbar region   Discharged Condition: good  Hospital Course: I performed bilateral L2-3, L3-4 and L4-5 laminotomy/foraminotomies with a instrumentation and fusion at L4-5 on the patient on 11/14/2023.  The surgery went well.  The patient's postoperative course was unremarkable.  On postoperative day #1 she felt well and requested discharge to home.  She was given verbal and written discharge instructions.  All her questions were answered.  Consults: PT, care management Significant Diagnostic Studies: None Treatments: Bilateral L2-3, L3-4 and L4-5 laminotomies/foraminotomies with instrumentation and fusion at L4-5 Discharge Exam: Blood pressure (!) 109/56, pulse 70, temperature 98.9 F (37.2 C), temperature source Oral, resp. rate 18, height 5' 8 (1.727 m), weight 101.2 kg, SpO2 96%. The patient is alert and pleasant.  She looks well.  Her strength is normal.  Disposition: Home  Discharge Instructions     Call MD for:  difficulty breathing, headache or visual disturbances   Complete by: As directed    Call MD for:  difficulty breathing, headache or visual disturbances   Complete by: As directed    Call MD for:  extreme fatigue   Complete by: As directed    Call MD for:  extreme fatigue   Complete by: As directed    Call MD for:  hives   Complete by: As directed    Call MD for:  hives   Complete by: As directed    Call MD for:  persistant dizziness or light-headedness   Complete by: As directed    Call MD for:  persistant dizziness or light-headedness   Complete by: As directed    Call MD for:   persistant nausea and vomiting   Complete by: As directed    Call MD for:  persistant nausea and vomiting   Complete by: As directed    Call MD for:  redness, tenderness, or signs of infection (pain, swelling, redness, odor or green/yellow discharge around incision site)   Complete by: As directed    Call MD for:  redness, tenderness, or signs of infection (pain, swelling, redness, odor or green/yellow discharge around incision site)   Complete by: As directed    Call MD for:  severe uncontrolled pain   Complete by: As directed    Call MD for:  severe uncontrolled pain   Complete by: As directed    Call MD for:  temperature >100.4   Complete by: As directed    Call MD for:  temperature >100.4   Complete by: As directed    Diet - low sodium heart healthy   Complete by: As directed    Discharge instructions   Complete by: As directed    Call [phone] for a followup appointment. Take a stool softener while you are using pain medications.   Discharge instructions   Complete by: As directed    Call [phone] for a followup appointment. Take a stool softener while you are using pain medications.   Driving Restrictions   Complete by: As directed    Do not drive for 2 weeks.   Driving Restrictions   Complete by: As directed    Do not drive for 2 weeks.   Increase activity slowly   Complete by: As directed  Increase activity slowly   Complete by: As directed    Lifting restrictions   Complete by: As directed    Do not lift more than 5 pounds. No excessive bending or twisting.   Lifting restrictions   Complete by: As directed    Do not lift more than 5 pounds. No excessive bending or twisting.   May shower / Bathe   Complete by: As directed    Remove the dressing for 3 days after surgery.  You may shower, but leave the incision alone.   May shower / Bathe   Complete by: As directed    Remove the dressing for 3 days after surgery.  You may shower, but leave the incision  alone.   Remove dressing in 48 hours   Complete by: As directed    Remove dressing in 48 hours   Complete by: As directed       Allergies as of 11/15/2023   No Known Allergies      Medication List     TAKE these medications    amLODipine  5 MG tablet Commonly known as: NORVASC  Take 1 tablet (5 mg total) by mouth daily.   aspirin EC 81 MG tablet Take 81 mg by mouth daily. Swallow whole.   carboxymethylcellulose 0.5 % Soln Commonly known as: REFRESH PLUS Place 1 drop into both eyes in the morning and at bedtime.   cyclobenzaprine 5 MG tablet Commonly known as: FLEXERIL Take 1 tablet (5 mg total) by mouth 3 (three) times daily as needed for muscle spasms.   docusate sodium 100 MG capsule Commonly known as: COLACE Take 1 capsule (100 mg total) by mouth 2 (two) times daily.   metoprolol tartrate 50 MG tablet Commonly known as: LOPRESSOR Take 50 mg by mouth 2 (two) times daily.   olmesartan-hydrochlorothiazide 40-25 MG tablet Commonly known as: BENICAR HCT Take 1 tablet by mouth daily.   oxyCODONE-acetaminophen 5-325 MG tablet Commonly known as: PERCOCET/ROXICET Take 1-2 tablets by mouth every 4 (four) hours as needed for moderate pain (pain score 4-6).   Ozempic (2 MG/DOSE) 8 MG/3ML Sopn Generic drug: Semaglutide (2 MG/DOSE) Inject 2 mg into the skin once a week.   potassium chloride  SA 20 MEQ tablet Commonly known as: KLOR-CON  M Take 1 tablet (20 mEq total) by mouth daily.   sitaGLIPtin-metformin 50-1000 MG tablet Commonly known as: JANUMET Take 1 tablet by mouth 2 (two) times daily with a meal.         Signed: Reyes JONETTA Budge 11/15/2023, 7:37 AM

## 2023-11-15 NOTE — Anesthesia Postprocedure Evaluation (Signed)
 Anesthesia Post Note  Patient: Jennifer Carey  Procedure(s) Performed: POSTERIOR LUMBAR FUSION 2 LEVEL (Bilateral: Back)     Patient location during evaluation: PACU Anesthesia Type: General Level of consciousness: awake Pain management: pain level controlled Vital Signs Assessment: post-procedure vital signs reviewed and stable Respiratory status: spontaneous breathing, nonlabored ventilation and respiratory function stable Cardiovascular status: blood pressure returned to baseline and stable Postop Assessment: no apparent nausea or vomiting Anesthetic complications: no   No notable events documented.  Last Vitals:  Vitals:   11/14/23 2352 11/15/23 0353  BP: (!) 116/55 (!) 109/56  Pulse: 75 70  Resp: 18 18  Temp: 36.9 C 37.2 C  SpO2: 94% 96%    Last Pain:  Vitals:   11/15/23 0525  TempSrc:   PainSc: 6                  [name] P Gustie Bobb

## 2023-11-15 NOTE — Evaluation (Signed)
 Physical Therapy Evaluation  Patient Details Name: Jennifer Carey MRN: [id number] DOB: July 02, 1955 Today's Date: 11/15/2023  History of Present Illness  Pt is a 68 y/o female who presents s/p L2-L5 PLIF on 11/14/2023. PMH significant for DM, HTN, Shoulder surgery.  Clinical Impression  Pt admitted with above diagnosis. At the time of PT eval, pt was able to demonstrate transfers and ambulation with gross CGA to supervision for safety. Pt was educated on precautions, brace application/wearing schedule, appropriate activity progression, and car transfer. Pt currently with functional limitations due to the deficits listed below (see PT Problem List). Pt will benefit from skilled PT to increase their independence and safety with mobility to allow discharge to the venue listed below.          If plan is discharge home, recommend the following: A little help with walking and/or transfers;A little help with bathing/dressing/bathroom;Assistance with cooking/housework;Assist for transportation;Help with stairs or ramp for entrance   Can travel by private vehicle        Equipment Recommendations None recommended by PT  Recommendations for Other Services       Functional Status Assessment Patient has had a recent decline in their functional status and demonstrates the ability to make significant improvements in function in a reasonable and predictable amount of time.     Precautions / Restrictions Precautions Precautions: Fall;Back Precaution Booklet Issued: Yes (comment) Recall of Precautions/Restrictions: Intact Precaution/Restrictions Comments: Reviewed handout and pt was cued for precautions during functional mobility. Required Braces or Orthoses: Spinal Brace Spinal Brace: Lumbar corset;Applied in sitting position Restrictions Weight Bearing Restrictions Per Provider Order: No      Mobility  Bed Mobility Overal bed mobility: Needs Assistance Bed Mobility: Rolling, Sidelying to  Sit Rolling: Min assist Sidelying to sit: Supervision       General bed mobility comments: Assist for full roll without rails, but no assist to transition to sitting    Transfers Overall transfer level: Modified independent Equipment used: None               General transfer comment: Pt demonstrated proper hand placement on seated surface for safety.    Ambulation/Gait Ambulation/Gait assistance: Supervision Gait Distance (Feet): 300 Feet Assistive device: None Gait Pattern/deviations: Step-through pattern, Decreased stride length, Trunk flexed Gait velocity: Decreased Gait velocity interpretation: <1.31 ft/sec, indicative of household ambulator   General Gait Details: Pt with good posture throughout gait. No overt LOB noted  Stairs Stairs: Yes Stairs assistance: Contact guard assist Stair Management: Two rails, Step to pattern, Forwards Number of Stairs: 4 General stair comments: VC'a for sequencing and general safety  Wheelchair Mobility     Tilt Bed    Modified Rankin (Stroke Patients Only)       Balance Overall balance assessment: Needs assistance Sitting-balance support: Feet supported, No upper extremity supported Sitting balance-Leahy Scale: Fair     Standing balance support: No upper extremity supported, During functional activity Standing balance-Leahy Scale: Fair                               Pertinent Vitals/Pain Pain Assessment Pain Assessment: Faces Faces Pain Scale: Hurts a little bit Pain Location: incision site Pain Descriptors / Indicators: Operative site guarding, Sore Pain Intervention(s): Limited activity within patient's tolerance, Monitored during session, Repositioned    Home Living Family/patient expects to be discharged to:: Private residence Living Arrangements: Non-relatives/Friends Available Help at Discharge: Family;Friend(s);Available 24 hours/day Type of Home:  House Home Access: Stairs to  enter Entrance Stairs-Rails: Right;Left;Can reach both Secretary/administrator of Steps: 3   Home Layout: One level Home Equipment: Agricultural Consultant (2 wheels);Grab bars - tub/shower      Prior Function Prior Level of Function : Independent/Modified Independent             Mobility Comments: Limited by pain, sleeping in the recliner to get ADLs Comments: Independent     Extremity/Trunk Assessment   Upper Extremity Assessment Upper Extremity Assessment: Overall WFL for tasks assessed    Lower Extremity Assessment Lower Extremity Assessment: LLE deficits/detail;RLE deficits/detail RLE Deficits / Details: Decreased strength and AROM consistent with pre-op diagnosis (bilaterally)    Cervical / Trunk Assessment Cervical / Trunk Assessment: Back Surgery  Communication   Communication Communication: No apparent difficulties    Cognition Arousal: Alert Behavior During Therapy: WFL for tasks assessed/performed   PT - Cognitive impairments: No apparent impairments                         Following commands: Intact       Cueing Cueing Techniques: Verbal cues, Gestural cues     General Comments      Exercises     Assessment/Plan    PT Assessment Patient needs continued PT services  PT Problem List Decreased strength;Decreased activity tolerance;Decreased balance;Decreased mobility;Decreased safety awareness;Decreased knowledge of precautions;Pain       PT Treatment Interventions DME instruction;Gait training;Stair training;Functional mobility training;Therapeutic activities;Therapeutic exercise;Cognitive remediation;Patient/family education    PT Goals (Current goals can be found in the Care Plan section)  Acute Rehab PT Goals Patient Stated Goal: Home today PT Goal Formulation: With patient Time For Goal Achievement: 11/22/23 Potential to Achieve Goals: Good    Frequency Min 5X/week     Co-evaluation               AM-PAC PT 6 Clicks  Mobility  Outcome Measure Help needed turning from your back to your side while in a flat bed without using bedrails?: A Little Help needed moving from lying on your back to sitting on the side of a flat bed without using bedrails?: A Little Help needed moving to and from a bed to a chair (including a wheelchair)?: A Little Help needed standing up from a chair using your arms (e.g., wheelchair or bedside chair)?: A Little Help needed to walk in hospital room?: A Little Help needed climbing 3-5 steps with a railing? : A Little 6 Click Score: 18    End of Session Equipment Utilized During Treatment: Gait belt;Back brace Activity Tolerance: Patient tolerated treatment well Patient left: in bed;with call bell/phone within reach Nurse Communication: Mobility status PT Visit Diagnosis: Unsteadiness on feet (R26.81);Pain Pain - part of body:  (back)    Time: 9158-9084 PT Time Calculation (min) (ACUTE ONLY): 34 min   Charges:   PT Evaluation $PT Eval Low Complexity: 1 Low   PT General Charges $$ ACUTE PT VISIT: 1 Visit         Leita Sable, PT, DPT Acute Rehabilitation Services Secure Chat Preferred Office: [phone]   Leita JONETTA Sable 11/15/2023, 9:39 AM

## 2023-11-17 MED FILL — Heparin Sodium (Porcine) Inj 1000 Unit/ML: INTRAMUSCULAR | Qty: 30 | Status: AC

## 2023-11-17 MED FILL — Sodium Chloride IV Soln 0.9%: INTRAVENOUS | Qty: 1000 | Status: AC
